# Patient Record
Sex: Male | Born: 1970 | Race: White | Hispanic: No | Smoking: Never smoker
Health system: Southern US, Community
[De-identification: ages and names within clinical notes are randomized; demographics above are authoritative.]

## PROBLEM LIST (undated history)

## (undated) DIAGNOSIS — E119 Type 2 diabetes mellitus without complications: Secondary | ICD-10-CM

## (undated) DIAGNOSIS — E785 Hyperlipidemia, unspecified: Secondary | ICD-10-CM

## (undated) DIAGNOSIS — F909 Attention-deficit hyperactivity disorder, unspecified type: Secondary | ICD-10-CM

## (undated) DIAGNOSIS — K529 Noninfective gastroenteritis and colitis, unspecified: Secondary | ICD-10-CM

## (undated) DIAGNOSIS — F319 Bipolar disorder, unspecified: Secondary | ICD-10-CM

## (undated) DIAGNOSIS — F419 Anxiety disorder, unspecified: Secondary | ICD-10-CM

## (undated) DIAGNOSIS — K279 Peptic ulcer, site unspecified, unspecified as acute or chronic, without hemorrhage or perforation: Secondary | ICD-10-CM

## (undated) DIAGNOSIS — K219 Gastro-esophageal reflux disease without esophagitis: Secondary | ICD-10-CM

## (undated) DIAGNOSIS — I10 Essential (primary) hypertension: Secondary | ICD-10-CM

## (undated) DIAGNOSIS — S66801A Unspecified injury of other specified muscles, fascia and tendons at wrist and hand level, right hand, initial encounter: Secondary | ICD-10-CM

## (undated) HISTORY — DX: Essential (primary) hypertension: I10

## (undated) HISTORY — DX: Noninfective gastroenteritis and colitis, unspecified: K52.9

## (undated) HISTORY — DX: Anxiety disorder, unspecified: F41.9

## (undated) HISTORY — PX: EYE SURGERY: SHX253

## (undated) HISTORY — DX: Bipolar disorder, unspecified: F31.9

## (undated) HISTORY — DX: Peptic ulcer, site unspecified, unspecified as acute or chronic, without hemorrhage or perforation: K27.9

---

## 2004-12-29 ENCOUNTER — Emergency Department: Payer: Self-pay | Admitting: Emergency Medicine

## 2005-01-03 ENCOUNTER — Emergency Department (HOSPITAL_COMMUNITY): Admission: EM | Admit: 2005-01-03 | Discharge: 2005-01-03 | Payer: Self-pay | Admitting: Emergency Medicine

## 2005-09-05 ENCOUNTER — Emergency Department (HOSPITAL_COMMUNITY): Admission: EM | Admit: 2005-09-05 | Discharge: 2005-09-05 | Payer: Self-pay | Admitting: Emergency Medicine

## 2005-09-08 DIAGNOSIS — K279 Peptic ulcer, site unspecified, unspecified as acute or chronic, without hemorrhage or perforation: Secondary | ICD-10-CM

## 2005-09-08 HISTORY — DX: Peptic ulcer, site unspecified, unspecified as acute or chronic, without hemorrhage or perforation: K27.9

## 2006-07-01 ENCOUNTER — Ambulatory Visit: Payer: Self-pay | Admitting: Gastroenterology

## 2006-07-01 ENCOUNTER — Inpatient Hospital Stay (HOSPITAL_COMMUNITY): Admission: EM | Admit: 2006-07-01 | Discharge: 2006-07-04 | Payer: Self-pay | Admitting: Emergency Medicine

## 2006-07-03 ENCOUNTER — Encounter (INDEPENDENT_AMBULATORY_CARE_PROVIDER_SITE_OTHER): Payer: Self-pay | Admitting: Specialist

## 2006-07-03 HISTORY — PX: ESOPHAGOGASTRODUODENOSCOPY: SHX1529

## 2006-07-09 ENCOUNTER — Inpatient Hospital Stay (HOSPITAL_COMMUNITY): Admission: AD | Admit: 2006-07-09 | Discharge: 2006-07-11 | Payer: Self-pay | Admitting: Gastroenterology

## 2006-07-10 ENCOUNTER — Encounter (INDEPENDENT_AMBULATORY_CARE_PROVIDER_SITE_OTHER): Payer: Self-pay | Admitting: *Deleted

## 2006-07-10 ENCOUNTER — Ambulatory Visit: Payer: Self-pay | Admitting: Internal Medicine

## 2006-07-10 HISTORY — PX: ESOPHAGOGASTRODUODENOSCOPY: SHX1529

## 2006-07-14 ENCOUNTER — Ambulatory Visit: Payer: Self-pay | Admitting: Gastroenterology

## 2006-09-21 ENCOUNTER — Emergency Department (HOSPITAL_COMMUNITY): Admission: EM | Admit: 2006-09-21 | Discharge: 2006-09-21 | Payer: Self-pay | Admitting: Emergency Medicine

## 2006-09-28 ENCOUNTER — Ambulatory Visit: Payer: Self-pay | Admitting: Gastroenterology

## 2006-10-16 ENCOUNTER — Ambulatory Visit: Payer: Self-pay | Admitting: Gastroenterology

## 2006-10-31 ENCOUNTER — Emergency Department (HOSPITAL_COMMUNITY): Admission: EM | Admit: 2006-10-31 | Discharge: 2006-10-31 | Payer: Self-pay | Admitting: Emergency Medicine

## 2006-11-13 ENCOUNTER — Ambulatory Visit: Payer: Self-pay | Admitting: Gastroenterology

## 2007-02-12 ENCOUNTER — Ambulatory Visit: Payer: Self-pay | Admitting: Gastroenterology

## 2007-05-18 ENCOUNTER — Ambulatory Visit: Payer: Self-pay | Admitting: Gastroenterology

## 2007-09-29 ENCOUNTER — Emergency Department (HOSPITAL_COMMUNITY): Admission: EM | Admit: 2007-09-29 | Discharge: 2007-09-29 | Payer: Self-pay | Admitting: Emergency Medicine

## 2007-10-01 ENCOUNTER — Ambulatory Visit: Payer: Self-pay | Admitting: Gastroenterology

## 2008-01-25 ENCOUNTER — Emergency Department (HOSPITAL_COMMUNITY): Admission: EM | Admit: 2008-01-25 | Discharge: 2008-01-25 | Payer: Self-pay | Admitting: Emergency Medicine

## 2008-01-25 ENCOUNTER — Encounter: Payer: Self-pay | Admitting: Orthopedic Surgery

## 2008-02-21 ENCOUNTER — Encounter: Payer: Self-pay | Admitting: Orthopedic Surgery

## 2008-02-21 ENCOUNTER — Emergency Department (HOSPITAL_COMMUNITY): Admission: EM | Admit: 2008-02-21 | Discharge: 2008-02-21 | Payer: Self-pay | Admitting: Emergency Medicine

## 2008-02-25 ENCOUNTER — Encounter: Payer: Self-pay | Admitting: Orthopedic Surgery

## 2008-02-28 ENCOUNTER — Ambulatory Visit: Payer: Self-pay | Admitting: Orthopedic Surgery

## 2008-02-28 DIAGNOSIS — M25549 Pain in joints of unspecified hand: Secondary | ICD-10-CM

## 2008-03-22 ENCOUNTER — Emergency Department (HOSPITAL_COMMUNITY): Admission: EM | Admit: 2008-03-22 | Discharge: 2008-03-22 | Payer: Self-pay | Admitting: Emergency Medicine

## 2008-04-26 ENCOUNTER — Emergency Department (HOSPITAL_COMMUNITY): Admission: EM | Admit: 2008-04-26 | Discharge: 2008-04-26 | Payer: Self-pay | Admitting: Emergency Medicine

## 2008-04-27 ENCOUNTER — Inpatient Hospital Stay (HOSPITAL_COMMUNITY): Admission: EM | Admit: 2008-04-27 | Discharge: 2008-04-30 | Payer: Self-pay | Admitting: Emergency Medicine

## 2008-05-19 ENCOUNTER — Emergency Department (HOSPITAL_COMMUNITY): Admission: EM | Admit: 2008-05-19 | Discharge: 2008-05-19 | Payer: Self-pay | Admitting: Emergency Medicine

## 2008-05-19 ENCOUNTER — Encounter: Payer: Self-pay | Admitting: Orthopedic Surgery

## 2008-05-20 ENCOUNTER — Emergency Department (HOSPITAL_COMMUNITY): Admission: EM | Admit: 2008-05-20 | Discharge: 2008-05-20 | Payer: Self-pay | Admitting: Emergency Medicine

## 2008-05-23 ENCOUNTER — Ambulatory Visit: Payer: Self-pay | Admitting: Orthopedic Surgery

## 2008-05-23 DIAGNOSIS — S62329A Displaced fracture of shaft of unspecified metacarpal bone, initial encounter for closed fracture: Secondary | ICD-10-CM | POA: Insufficient documentation

## 2008-06-14 ENCOUNTER — Ambulatory Visit: Payer: Self-pay | Admitting: Orthopedic Surgery

## 2008-06-19 ENCOUNTER — Telehealth: Payer: Self-pay | Admitting: Orthopedic Surgery

## 2008-11-25 ENCOUNTER — Emergency Department: Payer: Self-pay | Admitting: Emergency Medicine

## 2008-12-02 ENCOUNTER — Emergency Department: Payer: Self-pay | Admitting: Emergency Medicine

## 2008-12-03 ENCOUNTER — Emergency Department: Payer: Self-pay | Admitting: Emergency Medicine

## 2008-12-06 ENCOUNTER — Emergency Department: Payer: Self-pay | Admitting: Emergency Medicine

## 2009-06-27 ENCOUNTER — Emergency Department: Payer: Self-pay | Admitting: Emergency Medicine

## 2009-08-15 ENCOUNTER — Emergency Department: Payer: Self-pay | Admitting: Emergency Medicine

## 2009-10-21 ENCOUNTER — Emergency Department: Payer: Self-pay | Admitting: Emergency Medicine

## 2010-03-08 ENCOUNTER — Emergency Department: Payer: Self-pay | Admitting: Internal Medicine

## 2010-04-19 ENCOUNTER — Emergency Department: Payer: Self-pay | Admitting: Emergency Medicine

## 2010-06-19 ENCOUNTER — Emergency Department: Payer: Self-pay | Admitting: Emergency Medicine

## 2010-06-24 ENCOUNTER — Telehealth (INDEPENDENT_AMBULATORY_CARE_PROVIDER_SITE_OTHER): Payer: Self-pay

## 2010-07-03 ENCOUNTER — Emergency Department: Payer: Self-pay | Admitting: Emergency Medicine

## 2010-07-05 ENCOUNTER — Ambulatory Visit: Payer: Self-pay | Admitting: Gastroenterology

## 2010-07-05 DIAGNOSIS — R112 Nausea with vomiting, unspecified: Secondary | ICD-10-CM

## 2010-07-05 DIAGNOSIS — K589 Irritable bowel syndrome without diarrhea: Secondary | ICD-10-CM

## 2010-07-05 DIAGNOSIS — S025XXA Fracture of tooth (traumatic), initial encounter for closed fracture: Secondary | ICD-10-CM | POA: Insufficient documentation

## 2010-07-05 DIAGNOSIS — R1013 Epigastric pain: Secondary | ICD-10-CM

## 2010-07-09 ENCOUNTER — Telehealth (INDEPENDENT_AMBULATORY_CARE_PROVIDER_SITE_OTHER): Payer: Self-pay

## 2010-07-16 ENCOUNTER — Emergency Department: Payer: Self-pay | Admitting: Emergency Medicine

## 2010-07-20 ENCOUNTER — Emergency Department: Payer: Self-pay | Admitting: Unknown Physician Specialty

## 2010-07-23 ENCOUNTER — Telehealth (INDEPENDENT_AMBULATORY_CARE_PROVIDER_SITE_OTHER): Payer: Self-pay

## 2010-07-29 ENCOUNTER — Telehealth (INDEPENDENT_AMBULATORY_CARE_PROVIDER_SITE_OTHER): Payer: Self-pay

## 2010-08-05 ENCOUNTER — Encounter: Payer: Self-pay | Admitting: Gastroenterology

## 2010-08-05 ENCOUNTER — Ambulatory Visit: Payer: Self-pay | Admitting: Gastroenterology

## 2010-08-05 DIAGNOSIS — K219 Gastro-esophageal reflux disease without esophagitis: Secondary | ICD-10-CM

## 2010-08-05 DIAGNOSIS — I10 Essential (primary) hypertension: Secondary | ICD-10-CM

## 2010-08-06 ENCOUNTER — Telehealth (INDEPENDENT_AMBULATORY_CARE_PROVIDER_SITE_OTHER): Payer: Self-pay

## 2010-08-09 ENCOUNTER — Telehealth (INDEPENDENT_AMBULATORY_CARE_PROVIDER_SITE_OTHER): Payer: Self-pay

## 2010-09-30 ENCOUNTER — Encounter: Payer: Self-pay | Admitting: Emergency Medicine

## 2010-10-08 NOTE — Progress Notes (Signed)
Summary: phone note/ pt now dx with hypertension  Phone Note Call from Patient   Caller: Patient Summary of Call: Pt called to say he has been diagnosed with hypertension. PCP started on Amlodipine yesterday. He is supposed to go back in a few days and have some labs if they are OK he will dc amlodipine and start Lisinopril and Metoprolol. He just wanted to inform Dr. Darrick Penna and extenders here. Initial call taken by: Cloria Spring LPN,  July 23, 2010 11:57 AM

## 2010-10-08 NOTE — Progress Notes (Signed)
Summary: FYI  on BP meds  Phone Note Call from Patient   Caller: Patient Summary of Call: Pt left Vm that he saw PCP this AM for BP. Amlodipine was incresed to 10mg  once daily and he was started on Lisinopril but he wasn't sure of the dosage.  Initial call taken by: Cloria Spring LPN,  August 06, 2010 11:53 AM

## 2010-10-08 NOTE — Progress Notes (Signed)
Summary: phone note/ ? about pain med til appt  Phone Note Call from Patient   Caller: Patient Summary of Call: Pt called. Said he hasn't been seen here for a while. He moved to [place] after separating from his wife. He has had to go to ED in [place] for a couple of times. Was started on Reglan and it has helped alot. Was just back at the ED last Wed. ED doctor would not give Rx for Reglan. Gave him a Rx for Tramadol. Said he tried to explain to them to give him Vicodin because of his stomach but they would not. He has scheduled appt with Tana Coast, PA for 07/05/2010. He wants to know if Dr.Fields will call him give him Rx for Vicodin or say it is OK to take the Tramadol until his appt here. He can be reached at [phone].  Initial call taken by: Cloria Spring LPN,  June 24, 2010 11:02 AM     Appended Document: phone note/ ? about pain med til appt Please call pt. He may use Ultram. We do NOT Rx narcotics for chronic abd pain.  Appended Document: phone note/ ? about pain med til appt Pt informed.

## 2010-10-08 NOTE — Progress Notes (Signed)
Summary: phone note/ pt having pains below right ribs  Phone Note Call from Patient   Caller: Patient Summary of Call: Pt called and said he has had a dull pain in his right side below ribs x one and a half days. 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 He said it was so bad yesterday and he had  to call out of work. He took some Vicodin 7.5 that he had left and it helped some. He wants to know what Dr. Darrick Penna advises. He is aware she is at the hospital and it will be later when we call. He can be reached at [phone]. Initial call taken by: Cloria Spring LPN,  August 09, 2010 9:35 AM     Appended Document: phone note/ pt having pains below right ribs Please call pt. He has IBS and will have pain intermittently. Use Maalox or Mylanta as needed pain and follow a full liquid diet for 24-48 hours if he has pain. Avoid dairy.  Appended Document: phone note/ pt having pains below right ribs LMOM to call.  Appended Document: phone note/ pt having pains below right ribs Pt informed.

## 2010-10-08 NOTE — Progress Notes (Signed)
Summary: pain rx  Phone Note Call from Patient Call back at Home Phone [phone]   Caller: Patient Summary of Call: pt called- he went last week to get his wisdom teeth removed and they wouldnt do it because his bp was 154/100. pt goes back to his pcp on 08/23/10 for a physical and to see about having his bp meds changed. Pt is requesting vicodin 5/500  to last untill his appt here with [facility] on Monday the 28th and then he will talk to her about it. He also stated the only providers in this office he wanted and trusted was [facility] and [facility]. pt uses [facility] [address], [id number] Initial call taken by: Hendricks Limes LPN,  July 29, 2010 11:15 AM     Appended Document: pain rx    Prescriptions: HYDROCODONE-ACETAMINOPHEN 5-500 MG TABS (HYDROCODONE-ACETAMINOPHEN) 1 by mouth daily as needed for pain  #20 x 0   Entered and Authorized by:   Leanna Battles. Dixon Boos   Signed by:   Leanna Battles Lewis PA-C on 07/29/2010   Method used:   Printed then faxed to ...       [facility] [address] (retail)       [address]       [place], [place]  [address]       Ph: [phone]       Fax: [phone]   RxID:   [id number]     Appended Document: pain rx [facility] pt aware

## 2010-10-08 NOTE — Assessment & Plan Note (Signed)
Summary: ALOT OF STOMACH PAIN/HX OF GU/LAW   Visit Type:  f/u Primary Care Provider:  Dwana [name]  Chief Complaint:  abd pain.  History of Present Illness: [name] is a pleasant 40 y/o WM, with h/o intermittent abd pain, n/v, treated for functional gut disorder by Dr. Darrick Penna. He transferred his care when he moved to [facility] a couple of years ago. He states he wants to resume his care here with Dr. Darrick Penna. He reports having two severe episodes of abd pain, n/v in last two years. One year ago, he reports being hospitalized at [facility].   Aug 12th, went to hospital to ED in [place] and two weeks ago as well. Was put on Reglan in 8/11, works well. Tues night went back to ED, sat for four hours, was not seen. Taking Aleve/ibuprofen for broke tooth for past three weeks. Seeing dentist, Dr. Montez Morita, having to make payments to have tooth pulled. Has appt 07/15/10. Given tramadol in ED, did not help pain. He reports taking two bottles of Aleve for tooth pain.   He states he has lot of stress or custody issues with his 38 year old son. Has not seen him in over one year. Unable to afford lawyer to draw up custody or visitation agreements. Found out today that ex-wife took son out of state without permission. Stomach issues worse with stress.    C/O cramping in upper abd, stomach gets sour, starts refluxing/nausea/dry heaves. Happens once per month. Bowels 70% loose. BM 2 per day, no [name], brbpr. Cramping usually leads to bowel movements. States Reglan controlled his symptoms while on it.   In 2007, during hospitalization, he was suspected to have intermittent gastric volvulus/torsion but could not be proven. He had EGD on both 07/03/06 and 07/10/06. First EGD, antrum was dusky in appearance and there was edema and hemorrhage of the cardia. On second EGD, two very small antral ulcers but overall appearance of stomach was much improved.    Current Medications (verified): 1)  Depakote 500  Mg  Tbec (Divalproex Sodium) .... One By Mouth 4 X Day 2)  Celexa 20 Mg Tabs (Citalopram Hydrobromide) .... Take 1 Tablet By Mouth Once A Day 3)  Zantac 150 Mg Tabs (Ranitidine Hcl) .... Take 1 Tablet By Mouth Two Times A Day 4)  Adderall 20 Mg Tabs (Amphetamine-Dextroamphetamine) .... Take 1 Tablet By Mouth Three Times A Day  Allergies (verified): 1)  ! Pcn 2)  ! Ibuprofen 3)  ! Morphine  Social History: Patient is divorced. 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 Head:  Normocephalic and atraumatic. Eyes:  sclera nonicteric Mouth:  op moist Lungs:  Clear throughout to auscultation. Heart:  Regular rate and rhythm; no murmurs, rubs,  or bruits. Abdomen:  Soft. Flat. Positive BS. No abd bruit or hernia. Mild epigastric tenderness. No rebound or guarding.  Extremities:  No clubbing, cyanosis, edema or deformities noted. Neurologic:  Alert and  oriented x4;  grossly normal neurologically. Skin:  Intact without significant lesions or rashes. Psych:  Alert and cooperative. Normal mood and affect.  Impression & Recommendations:  Problem # 1:  EPIGASTRIC PAIN (ICD-789.06)  Upper abd pain associated with N/V/D. H/O IBS. Symptoms worse with increased stress. Pain worse today since he found out that his ex-wife "took off" with his 46 yr old son. He has been consuming significant amount of NSAIDS due to fractured tooth. Scheduled to have tooth extracted on 07/15/10 after he pays the dentist. Cannot exclude gastritis. He has had recurrent symptoms off/on since 8/11 (prior to NSAID  consumption). Needs to be treated for GERD/gastritis/PUD/IBS. If symptoms do not improve, then consider further w/u. At this time, no alarm symptoms.   RX for bentyl, hydrocodone/apap (for dental pain), samples of Nexium given. He can take OTC PPI when samples run out. If symptoms worse, he is to call. He should notify us if sees blood in stool or go to ED for [name].  Otherwise OV with Dr. Darrick Penna in four weeks.   Orders: Est. Patient Level II ([address]) Prescriptions: DICYCLOMINE HCL 10 MG CAPS (DICYCLOMINE HCL) one by mouth up to four times daily for abd cramps and loose stools  #120 x 0   Entered and Authorized by:   Leanna Battles. Dixon Boos   Signed by:   Leanna Battles Lewis PA-C on 07/05/2010   Method used:   Print then Give to Patient   RxID:   [id number] HYDROCODONE-ACETAMINOPHEN 7.5-325 MG TABS (HYDROCODONE-ACETAMINOPHEN) one by mouth every 4-6 hours as needed severe pain  #20 x 0   Entered and Authorized by:   Leanna Battles. Dixon Boos   Signed by:   Leanna Battles Lewis PA-C on 07/05/2010   Method used:   Print then Give to Patient   RxID:   [id number]   Appended Document: ALOT OF STOMACH PAIN/HX OF GU/LAW 4 WK F/U IS IN THE COMPUTER

## 2010-10-08 NOTE — Progress Notes (Signed)
Summary: pain rx too strong. pt requesting something different  Phone Note Call from Patient Call back at [facility] Phone [phone]   Caller: Patient Summary of Call: pt called- the pain medication he was given during his ov is too strong. pt is requesting hydrocodone/apap 5/500. makes pt too sleepy to work. pt uses [facility] [place] [id number]. Initial call taken by: Hendricks Limes LPN,  July 09, 2010 11:24 AM     Appended Document: pain rx too strong. pt requesting something different    Prescriptions: HYDROCODONE-ACETAMINOPHEN 5-500 MG TABS (HYDROCODONE-ACETAMINOPHEN) 1 by mouth daily as needed for pain  #20 x 0   Entered and Authorized by:   Joselyn Arrow FNP-BC   Signed by:   Joselyn Arrow FNP-BC on 07/09/2010   Method used:   Printed then faxed to ...         RxID:   [id number]     Appended Document: pain rx too strong. pt requesting something different rx faxed to [facility]. pt aware

## 2010-10-08 NOTE — Letter (Signed)
Summary: FU WITH SF IN 4 WEEKS,ABD PAIN,DIARRHEA/SS   Current Medications (verified): 1)  Depakote 500 Mg  Tbec (Divalproex Sodium) .... 4 At Bedtime 2)  Celexa 20 Mg Tabs (Citalopram Hydrobromide) .... Take 1 Tablet By Mouth Once A Day 3)  Adderall 30 Mg Tabs (Amphetamine-Dextroamphetamine) .... 1/2 Three Times A Day 4)  Dicyclomine Hcl 10 Mg Caps (Dicyclomine Hcl) .... One By Mouth Up To Four Times Daily For Abd Cramps and Loose Stools 5)  Hydrocodone-Acetaminophen 5-500 Mg Tabs (Hydrocodone-Acetaminophen) .Marland Kitchen.. 1 By Mouth Daily As Needed For Pain 6)  Amlodipine Besylate 5 Mg Tabs (Amlodipine Besylate) .... Once Daily 7)  Nexium 40 Mg Cpdr (Esomeprazole Magnesium) .... Once Daily 8)  Xanax 1 Mg Tabs (Alprazolam) .... As Needed  Allergies (verified): 1)  ! Pcn 2)  ! Ibuprofen 3)  ! Morphine  Vital Signs:  Patient profile:   40 year old male Height:      69 inches Weight:      199 pounds BMI:     29.49 Temp:     98.3 degrees F oral Pulse rate:   100 / minute BP sitting:   140 / 120  (left arm) Cuff size:   regular  Vitals Entered By: Hendricks Limes LPN (August 05, 2010 2:25 PM)  Appended Document: IBS, GERD OPV IN 6 MONTHS IN COMPUTER  Appended Document: Orders Update    Clinical Lists Changes  Orders: Added new Service order of Est. Patient Level III ([address]) - Signed      Appended Document: IBS, GERD 6 MONTH F/U OPV IS IN THE COMPUTER

## 2010-10-10 ENCOUNTER — Telehealth (INDEPENDENT_AMBULATORY_CARE_PROVIDER_SITE_OTHER): Payer: Self-pay
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
## 2010-10-14 ENCOUNTER — Other Ambulatory Visit: Payer: Self-pay | Admitting: Internal Medicine

## 2010-10-14 ENCOUNTER — Encounter: Payer: Self-pay | Admitting: Internal Medicine

## 2010-10-14 DIAGNOSIS — R110 Nausea: Secondary | ICD-10-CM

## 2010-10-15 ENCOUNTER — Other Ambulatory Visit (HOSPITAL_COMMUNITY): Payer: Self-pay

## 2010-10-16 NOTE — Progress Notes (Addendum)
Summary: N/V  Phone Note Call from Patient Call back at Home Phone [phone]   Caller: Patient Summary of Call: pt called- having increased abd pain and nausea x 1 week. pt is taking bentyl, protonix and tramadol. He said he has been watching his diet and is trying to stick to clear liquids for the past 2 days. pt is requesting phenergan and vicodin called to [facility] [address]- [phone]. pt stated if hes not feeling better next week he will make appt to come in.  Initial call taken by: Hendricks Limes LPN,  October 10, 2010 3:50 PM     Appended Document: N/V will not prescribe vicodin (per Dr. Evelina Dun last note). OV next week. May need upper endoscopy. In interim, obtain LFTs, [place] abd. May have short course of phenergan, 25 mg by mouth, 1 every 6-8 hours as needed nausea, disp#30, no refills. If increased N/V, pain, present to nearest medical facility.   Appended Document: N/V LM on voicemail per pt request. [facility]. informed pt to call back and let me know which lab to send lab order  Appended Document: N/V pt is aware of his appt on 2/13 @ 3pm w/AS  Appended Document: N/V Pt scheduled for abd u/s 10/15/10@10 :30.Marland KitchenMarland KitchenPt aware of appt.  Appended Document: N/V pt never called back about lab, lab order mailed to pt because he lives in [place].

## 2010-10-17 ENCOUNTER — Encounter (INDEPENDENT_AMBULATORY_CARE_PROVIDER_SITE_OTHER): Payer: Self-pay

## 2010-10-17 ENCOUNTER — Encounter: Payer: Self-pay | Admitting: Gastroenterology

## 2010-10-17 DIAGNOSIS — R109 Unspecified abdominal pain: Secondary | ICD-10-CM | POA: Insufficient documentation

## 2010-10-21 ENCOUNTER — Ambulatory Visit: Payer: Self-pay | Admitting: Gastroenterology

## 2010-10-21 ENCOUNTER — Encounter: Payer: Self-pay | Admitting: Gastroenterology

## 2010-10-21 ENCOUNTER — Encounter: Payer: Self-pay | Admitting: Internal Medicine

## 2010-10-21 DIAGNOSIS — R109 Unspecified abdominal pain: Secondary | ICD-10-CM

## 2010-10-21 DIAGNOSIS — R197 Diarrhea, unspecified: Secondary | ICD-10-CM | POA: Insufficient documentation

## 2010-10-24 NOTE — Letter (Signed)
Summary: ABD U/S ORDER  ABD U/S ORDER   Imported By: Ave Filter 10/14/2010 11:53:16  _____________________________________________________________________  External Attachment:    Type:   Image     Comment:   External Document

## 2010-10-24 NOTE — Letter (Signed)
Summary: Recall, Labs Needed  [facility]  [address]   [place], [place] [address]   Phone: [phone]  Fax: [phone]    October 17, 2010  REEDY BIERNAT [address] [place], [place]  [address] [birth year]   Dear Mr. Raso,   Our records indicate it is time to repeat your blood work.  You can take the enclosed form to the lab on or near the date indicated.  Please make note of the new location of the lab:   [address], 2nd floor   [facility]  Our office will call you within a week to ten business days with the results.  If you do not hear from [place] in 10 business days, you should call the office.  If you have any questions regarding this, call the office at [phone], and ask for the nurse.  Labs are due ASAP.   Sincerely,    Hendricks Limes LPN  [facility] Ph: [phone]   Fax: [phone]

## 2010-10-24 NOTE — Miscellaneous (Signed)
Summary: Orders Update  Clinical Lists Changes  Problems: Added new problem of ABDOMINAL PAIN (ICD-789.00) Orders: Added new Test order of T-Hepatic Function [phone]) - Signed

## 2010-10-29 ENCOUNTER — Other Ambulatory Visit (HOSPITAL_COMMUNITY): Payer: Self-pay

## 2010-10-30 NOTE — Assessment & Plan Note (Signed)
Summary: nausea,vomiting OV next week per [facility]   Vital Signs:  Patient profile:   40 year old male Height:      69 inches Weight:      205 pounds BMI:     30.38 Temp:     98.2 degrees F oral Pulse rate:   80 / minute BP sitting:   130 / 80  (left arm) Cuff size:   large  Vitals Entered By: Cloria Spring LPN (October 21, 2010 3:01 PM)  Visit Type:  Follow-up Visit Primary Care Provider:  Duncan Dull, M.D.  CC:  nausea/vomiting.  History of Present Illness: Pt presents today in f/u for chronic abdominal pain, N/V. Hx of IBS. We had set up an [place] of abd and labs that he did not have done prior to this appt. 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 For three weeks feels bloated, has a pain that runs across top portion of abdomen, like a "cramp", decreased appetite, states it is 1/2 what it was. Has alternations of constipation and diarrhea. remote hx of blood in stool, none recently. Wakes up nauseated. Only thing that helps is dairy products. Eats 1/2 gallon of ice cream every few days.  Taking Protonix daily as instructed. Now taking Bentyl threex/day. States pain is constant.  Denies use of NSAIDs, goody's powders. Taking vicodin for stomach to "get him through". c/o continued reflux.   Current Medications (verified): 1)  Depakote 500 Mg  Tbec (Divalproex Sodium) .... 4 At Bedtime 2)  Celexa 20 Mg Tabs (Citalopram Hydrobromide) .... Take 1 Tablet By Mouth Once A Day 3)  Adderall 30 Mg Tabs (Amphetamine-Dextroamphetamine) .... 1/2 Three Times A Day 4)  Dicyclomine Hcl 10 Mg Caps (Dicyclomine Hcl) .... One By Mouth Up To Four Times Daily For Abd Cramps and Loose Stools 5)  Hydrocodone-Acetaminophen 5-500 Mg Tabs (Hydrocodone-Acetaminophen) .Marland Kitchen.. 1 By Mouth Daily As Needed For Pain 6)  Amlodipine Besylate 10 Mg Tabs (Amlodipine Besylate) .... Once Daily 7)  Xanax 1 Mg Tabs (Alprazolam) .... As Needed 8)  Protonix 40 Mg Tbec (Pantoprazole Sodium) .Marland Kitchen.. 1 By Mouth Daily 9)   Lisinopril .... Take 1 Tablet By Mouth Once A Day  Allergies (verified): 1)  ! Pcn 2)  ! Ibuprofen 3)  ! Morphine  Past History:  Past Medical History: Last updated: 08/05/2010 Mild PUD 2007 **EGD/bX X2-CHRONIC ACTIVE GASTRITIS, NO H. PYLORI IBS BIPOLAR  Past Surgical History: Last updated: 02/28/2008 none  Family History: Mother: DM, living Father: HTN, CVA. living Family History of Diabetes No FH of Colon Cancer:  Social History: Patient is divorced.  Nonsmoker, no alcohol or drugs. 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 Eyes:  Denies blurring, irritation, and discharge. ENT:  Denies sore throat, hoarseness, and difficulty swallowing. CV:  Denies chest pains and syncope. Resp:  Denies dyspnea at rest and wheezing. GI:  See HPI. GU:  Denies urinary burning and urinary frequency. MS:  Denies joint pain / LOM, joint swelling, and joint stiffness. Derm:  Denies rash, itching, and dry skin. Neuro:  Denies weakness and syncope. Psych:  Denies depression and anxiety. Endo:  Denies cold intolerance and heat intolerance.  Physical Exam  General:  Well developed, well nourished, no acute distress. Head:  Normocephalic and atraumatic. Neck:  Supple; no masses or thyromegaly. Lungs:  Clear throughout to auscultation. Heart:  Regular rate and rhythm; no murmurs, rubs,  or bruits. Abdomen:  +  BS, soft, tender to palpation epigastric region, diffusely mildly tender abdomen, no HSM, no rebound or guarding, no masses noted.  Msk:  Symmetrical with no gross deformities. Normal posture. Pulses:  Normal pulses noted. Neurologic:  Alert and  oriented x4;  grossly normal neurologically. Skin:  Intact without significant lesions or rashes. Psych:  Alert and cooperative. Normal mood and affect.   Impression & Recommendations:  Problem # 1:  ABDOMINAL PAIN-MULTIPLE SITES (ICD-19.19)  40 year old  male with hx of chronic abdominal pain, likely r/t IBS, yet now presenting with a 3 week hx of "crampy" constant epigastric pain as well as diffuse abdominal pain, +n/v, especially nauseated in am. c/o decreased appetite. On Protonix daily. Denies use of NSAIDs, goodys, etc. Does have hx of PUD in past (EGD 2007). Diff include gastritis vs PUD, functional abdominal pain, doubt biliary component.   Continue Protonix EGD in OR with Dr. Jena Gauss in near future secondary to polypharmacy: the R/B/A have been discussed in detail; he has given verbal consent Labs to include CBC, CMP, lipase, amylase  Orders: Est. Patient Level II ([address])  Problem # 2:  IRRITABLE BOWEL SYNDROME (ICD-564.1)  hx of IBS-mixed, yet worsening of abdominal pain. Takes bentyl as needed, does have remote hx of brbpr in past. None currently. Likely hematochezia r/t benign anorectal source, yet pt has never had colonoscopy.   TCS with Dr. Jena Gauss in OR (along with EGD): the R/B/A have been discussed in detail; he has given verbal consent.  As of note, pt requested narcotics. Informed he may take extra strength tylenol.   Orders: Est. Patient Level II ([address])  Other Orders: T-CBC w/Diff [phone]) T-Amylase [phone]) T-Lipase [id number]) T-Comprehensive Metabolic Panel [phone])   Orders Added: 1)  T-CBC w/Diff [[id number]] 2)  T-Amylase [[id number]] 3)  T-Lipase [[id number]] 4)  T-Comprehensive Metabolic Panel [[id number]] 5)  Est. Patient Level II [[address]]

## 2010-10-30 NOTE — Letter (Addendum)
Summary: TCS/EGD ORDER  TCS/EGD ORDER   Imported By: Ave Filter 10/21/2010 16:06:35  _____________________________________________________________________  External Attachment:    Type:   Image     Comment:   External Document  Appended Document: TCS/EGD ORDER Pt NO SHOWED for his pre-op visit. We were unable to reach the pt so we had to cx him until we make contact.

## 2010-11-01 ENCOUNTER — Other Ambulatory Visit (HOSPITAL_COMMUNITY): Payer: Self-pay

## 2010-11-05 ENCOUNTER — Encounter: Payer: Self-pay | Admitting: Internal Medicine

## 2010-11-05 ENCOUNTER — Ambulatory Visit (HOSPITAL_COMMUNITY): Admission: RE | Admit: 2010-11-05 | Payer: Self-pay | Source: Ambulatory Visit | Admitting: Internal Medicine

## 2010-11-19 ENCOUNTER — Encounter (INDEPENDENT_AMBULATORY_CARE_PROVIDER_SITE_OTHER): Payer: Self-pay | Admitting: *Deleted

## 2010-11-26 NOTE — Letter (Signed)
Summary: Radiology Test Reminder  [facility]  [address]   [place], [place] [address]   Phone: [phone]  Fax: [phone]     November 19, 2010   XYLER TERPENING [address] [place], [place]  [address]  [place] 11/18/1970  Dear Mr. Gallery,  During your last appointment, your doctor requested you have an Ultrasound.  Our records indicate you have not had this done.  Remember it is very important to follow your doctor's instructions.  Please have this done as soon as possible.  If you have questions regarding this appointment, please call our office and we can reschedule this for you.  It is important that patients and their doctor work together in the management and treatment of their health care.  If you have already had your test done, please disregard this letter.  Thank you,    Ave Filter  [facility] Ph: [phone]   Fax: [phone]

## 2011-01-10 ENCOUNTER — Encounter: Payer: Self-pay | Admitting: Gastroenterology

## 2011-01-21 NOTE — Assessment & Plan Note (Signed)
NAMEMarland Kitchen  [name] [name], [name] [name]           CHART#:  [id number]   DATE:  02/12/2007                       DOB:  [birth year]   PROBLEM LIST:  1. 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  2. Bipolar disorder.  3. Borderline diabetes.  4. Allergy to PENICILLIN.   SUBJECTIVE:  [name] [name] is a 40 year old male who presents as a  return patient visit.  He continues to have intermittent bouts of right  upper quadrant pain associated with vomiting and diarrhea.  He has  modified his diet in that he eats nothing fried.  When he was initially  seen and evaluated in November 2007, he weighed 197 pounds, and today he  weighs 219 pounds.  He states that he only eats chicken and pasta, but  occasionally he eats a medium-rare steak.  He denies any alcohol use.  He continues to have episodes of sharp pain in his right upper quadrant  2-3 times a week.  He describes it as a pulsating pain that hits him and  hits him again.  It might cycle every 15 minutes and last up to four  hours.  It is associated with nausea and vomiting.  He had an upper  endoscopy in October, 2007 which showed mild chronic active gastritis,  and again in November, 2007 with duodenal biopsies as well as gastric  biopsies, which showed chronic active gastritis as well as no evidence  of celiac sprue.  His symptoms have not ever again been as severe as in  October and November, 2007.  He has not been using anti-inflammatory  drugs.  His pain has been intermittently controlled with Vicodin.  He  has never exhibited any evidence of drug-seeking behavior.  He has 0-2  soft bowel movements a day.  Some of his pain is reproducible with  movement.  He has no family history of pancreatic disease.  He does not  drink any alcohol.   MEDICATIONS:  1. Depakote.  2. Blase Mess.  3. Rozarem as needed.  4. Multivitamin.  5. Bentyl 20 mg three times a day.  6. Prilosec 20 mg twice daily.   OBJECTIVE:  VITAL SIGNS:  Weight 219 pounds.  Height 5 feet 10.  Temp  97.8, blood pressure 122/82, pulse 76.  GENERAL:  He is in no apparent distress.  Alert and oriented x4.  HEENT:  Normocephalic and atraumatic.  Pupils are equal and reactive to light.  Mouth: No oral lesions.  Posterior pharynx without erythema or exudate.  NECK:  Full range of motion with no lymphadenopathy.  LUNGS:  Clear to  auscultation bilaterally. CARDIOVASCULAR:  Regular rhythm.  No murmur.  Normal S1 and S2.  ABDOMEN:  Bowel sounds are present.  Soft.  Nondistended. He has increase in his right upper quadrant pain with  straight leg raises.  The pain is described as severe.  He also has pain  increased with raising his head and shoulders off the bed, and he  describes it as moderate tenderness.  This is consistent with a positive  Carnett's sign.  NEURO:  He has no focal neurological deficits.  EXTREMITIES:  No clubbing, cyanosis or edema.   ASSESSMENT:  [name] is a 40 year old male who has intermittent  right upper quadrant pain that is associated with nausea, vomiting, and  loose stool.  He has some component of his right upper quadrant pain  that may be related to a biliary source.  He has a component of his  right upper quadrant pain that appears to be musculoskeletal.  Thank you  for allowing me to see the patient in consultation.  My recommendations  follow.   RECOMMENDATIONS:  1. I will schedule an endoscopic ultrasound for patient with Dr. Wendall Papa to rule out distal common bile duct stone, microlithiasis,      or pancreatic etiology for his right upper quadrant pain.  2. He is given a prescription for Phenergan #25 1-2 p.o. every 6 hours      as needed for pain with one refill.  3. He is given a prescription for Vicodin 5/500  #20 1-2 p.o. every 6      hours as needed for pain with one refill.  He is asked to      distinguish between his musculoskeletal pain and his possible      biliary pain.  For his musculoskeletal pain, he is asked to use      Tylenol Arthritis.  4. He has a follow-up appointment to see me in one month.       Kassie Mends, M.D.  Electronically Signed     SM/MEDQ  D:  02/12/2007  T:  02/13/2007  Job:  [id number]   cc:   Rachael Fee, MD

## 2011-01-21 NOTE — H&P (Signed)
NAME:  [name] [name], [name] [name]          ACCOUNT NO.:  [id number]   MEDICAL RECORD NO.:  [id number]          PATIENT TYPE:  INP   LOCATION:  A332                          FACILITY:  [facility]   PHYSICIAN:  Angus G. Renard Matter, MD   DATE OF BIRTH:  01-13-1971   DATE OF ADMISSION:  DATE OF DISCHARGE:  LH                              HISTORY & PHYSICAL   A 40 year old white male presented to the ED with chief complaint of  pain in his left foot and some swelling.  Apparently, the patient is a  Leisure centre manager in [place], noted some pain, tenderness, and swelling in  his left foot approximately 3 days ago.  This became progressively  worse.  He was seen in the emergency room initially and given antibiotic  IV vancomycin and Rocephin.  Sent home on IV antibiotics and he just  became progressively worse and he came back to emergency room today.  He  was seen again by ED physician.  The inflammation and swelling in his  left foot and leg were worse and it was felt that he should be admitted  for IV antibiotics with this possibly being MRSA.   SOCIAL HISTORY:  The patient does not smoke or drink alcohol.   FAMILY HISTORY:  See previous record.   SURGICAL AND MEDICAL HISTORY:  The patient has had no prior surgery.  He  did have treatment for ulcer in 2007.   DRUG ALLERGIES:  PENICILLIN, rash; IBUPROFEN, he experienced nausea.   HOME MEDICATIONS:  1. Depakote 2000 mg at bedtime.  2. Multivitamin daily.  3. Celexa 20 mg daily.  4. Omeprazole 30 mg b.i.d.  5. Keflex oral.  6. Bactrim DS oral.  7. Percocet every 4 hours p.r.n.   REVIEW OF SYSTEMS:  HEENT:  Negative.  CARDIOPULMONARY:  No cough or  chest pain.  GI:  No nausea, vomiting, diarrhea, or pain.  No bleeding.   PHYSICAL EXAMINATION:  GENERAL:  Alert, well-developed white male.  VITAL SIGNS:  Blood pressure 109/61, respiration 20, pulse 67, and  temperature 97.8.  HEENT:  Eyes, PERRLA.  TMs negative.  Oropharynx benign.  NECK:  Supple.  No  JVD or thyroid abnormalities.  LUNGS:  Clear to P&A.  HEART:  Regular rhythm.  No cardiomegaly.  No murmurs.  ABDOMEN:  No palpable organs or masses.  No organomegaly.  SKIN:  The patient has swollen, tender, and inflamed left foot and lower  leg.   ASSESSMENT:  The patient is thought to have cellulitis, possible  methicillin-resistant Staphylococcus aureus.  He has been started on  Rocephin and vancomycin.  He is being admitted for further therapy with  this.      Angus G. Renard Matter, MD  Electronically Signed     AGM/MEDQ  D:  04/27/2008  T:  04/28/2008  Job:  [id number]

## 2011-01-21 NOTE — Group Therapy Note (Signed)
NAME:  [name], [name] NO.:  [id number]   MEDICAL RECORD NO.:  [id number]          PATIENT TYPE:  INP   LOCATION:  A332                          FACILITY:  [facility]   PHYSICIAN:  Catalina Pizza, M.D.        DATE OF BIRTH:  [birth year]   DATE OF PROCEDURE:  04/29/2008  DATE OF DISCHARGE:                                 PROGRESS NOTE   SUBJECTIVE:  [name] [name] is a 40 year old gentleman who presented with  5-day history of left foot swelling, erythema, and pain and felt to have  cellulitis related to breaking the skin between his fourth and fifth  toes, was seen in the emergency department x2 days and felt the need for  IV antibiotics.  He was admitted for IV antibiotics today.  He is doing  well, pain was much improved.  He does not have any difficulty with  ambulation.  Concern was raised that it could be MRSA, but there is no  acute signs of this at this time from  microbiology data.  The patient  denies any other problems.   OBJECTIVE:  VITAL SIGNS:  Temperature is 97.9, blood pressure is 109/62,  pulse is 76, respirations 20, and satting 96% on room air.  GENERAL:  This is a white male lying in bed in no acute distress.  HEENT:  Unremarkable.  LUNGS:  Clear to auscultation bilaterally.  HEART:  Regular rate and rhythm.  No murmurs, gallops, or rubs.  ABDOMEN:  Soft, nontender, and nondistended.  Positive bowel sounds.  EXTREMITIES:  Very trace edema on the dorsum of his foot, but improved.  Palpable pulses in all extremities.  SKIN:  Erythema has resolved in his left foot, he still has some mild  erythema between his fourth and fifth toes, but overall appears to look  symmetric compared to the other foot.   LABORATORY DATA:  CMET shows sodium 141, potassium 3.8, chloride 105,  CO2 29, glucose 130, BUN 7, creatinine is 0.87, total bili is 0.3, alk  phos 48, SGOT 15, SGPT 15, total protein 5.7, albumin 3.3, and calcium  of 8.3.  CBC shows a white count of 6.2,  hemoglobin of 13.7, and  platelet count of 190.  Repeat BMET was normal as well today.   IMPRESSION:  This is a 40 year old white male with cellulitis of the  left foot.   ASSESSMENT AND PLAN:  Cellulitis.  We will continue with antibiotics  through today with the Rocephin and vancomycin given the significant  improvement.  We will finish today's dose of IV antibiotics and change  over to oral regimen tomorrow.  We will likely continue on the Septra  and then if has any worsening pain or erythema returning, then start  back on the Keflex.   DISPOSITION:  As mentioned above, likely will be discharged tomorrow and  we will need routine followup of this.  Was previously assigned to the  Health Department  and may continue with this.      Catalina Pizza, M.D.  Electronically Signed     ZH/MEDQ  D:  04/29/2008  T:  04/30/2008  Job:  [id number]

## 2011-01-21 NOTE — Group Therapy Note (Signed)
NAME:  BENNETT, RAM NO.:  [id number]   MEDICAL RECORD NO.:  [id number]          PATIENT TYPE:  INP   LOCATION:  A332                          FACILITY:  [facility]   PHYSICIAN:  Catalina Pizza, M.D.        DATE OF BIRTH:  1970/12/10   DATE OF PROCEDURE:  DATE OF DISCHARGE:  04/30/2008                                 PROGRESS NOTE   Of note, history and physical is not available at this time by Dr.  Renard Matter.   SUBJECTIVE:  Briefly, this is a 40 year old white male who has had  approximately 5-day history of increased redness and swelling in his  left foot, came to the Emergency Department on Wednesday and was given a  dose of vancomycin and sent home with Keflex and Septra; was to return  on Thursday to get reassessed, rechecked, and was seen again by  emergency room doctor and felt that the erythema had spread even with  the antibiotics, thus wanted admission to the hospital for further IV  antibiotics.  He did not have any systemic issues with low white count  and has had no significant fever.  Emergency room doctor did bring  possibility of MRSA, although not have any specific indications for  this.  He also did have few pustules that came out on his midchest.  Question was related to infection versus reaction to medicines.  At this  time, the patient states that he does not have any significant pain,  feels that the redness and swelling is better than before, has no other  complaints.   OBJECTIVE:  VITAL SIGNS:  Temperature is 97.6, blood pressure 121/68,  pulse 78, respirations 20, and satting 96% on room air.  GENERAL:  This is a white male lying in bed in no acute stress.  HEENT:  Unremarkable.  LUNGS:  Clear to auscultation bilaterally.  HEART:  Regular rate and rhythm.  No murmurs, gallops, or rubs.  ABDOMEN:  Soft, nontender, and nondistended.  Positive bowel sounds.  EXTREMITIES:  Does have trace swelling in the dorsum of his left foot,  no other swelling.   Good pulses in all extremities.  SKIN:  He does have few healing erythematous bumps on his midchest.  Erythema in his left foot may have been spreading from between his  fourth and fifth toe on his left foot.  Erythema is very mild this time,  but does have some pain to palpation in the dorsum of his foot.  No  specific fluctuance and no significant pain to palpation, so this pain  does go above his ankle on the medial side but overall very minimal.  NEUROLOGIC:  No neurologic deficits noted.   Blood cultures x2 are still pending.   IMPRESSION:  This is a 40 year old white male with cellulitis of his  left foot.   ASSESSMENT/PLAN:  Cellulitis.  Given the response at this time, we will  continue on the vancomycin as well as the Rocephin.  He has had multiple  antibiotics started and is unclear exactly which one he has responded  to.  He tolerated the Keflex and  Septra then he was sent home without  any difficulty and may go back to those at the time of discharge.  He  does have improvement in his erythema and pain is not that significant.  We will check a CMET on him at this time.   DISPOSITION:  The patient was a patient of mine approximately 9 months  ago, but he turned out to have [employer] and was signed to the  [employer].  Since I was not accepting [place] access, the  patient was to find a new physician or go back to the Health Department.  There was some confusion over his admission yesterday and was admitted  to me, but we will continue to follow with the patient.  We will follow  with the patient on routine 30-day basis if needed, but since not taking  insurance essentially seen the patient for free.  We will attempt to  remedy this.  May need to discuss with the hospital as resuming his  care.      Catalina Pizza, M.D.  Electronically Signed     ZH/MEDQ  D:  04/28/2008  T:  04/28/2008  Job:  [id number]

## 2011-01-21 NOTE — Assessment & Plan Note (Signed)
NAME:  [name] [name], [name] [name]           CHART#:  [id number]   DATE:  10/01/2007                       DOB:  05/16/1971   REFERRING PHYSICIAN:  [facility].   PROBLEM LIST:  1. Intermittent abdominal pain and vomiting most likely secondary to a      functional gut disorder.  2. Bipolar disorder.  3. Borderline diabetes.  4. Allergy to penicillin.   SUBJECTIVE:  Mr. [name] [name] is a 40 year old male who presents as a  return patient visit.  He has been doing a lot better.  He is taking  mixed martial arts and eating a whole lot better.  He is working 2 jobs.  He complained of little stomach pain before he came to see me and  breaking out in a sweat.   MEDICATIONS:  1. Depakote.  2. Multivitamin.  3. Benzyl 20 mg t.i.d.  4. Prilosec b.i.d.  5. Vicodin as needed.  6. Lexapro 10 mg q.h.s.  7. Omega 3 2 daily.   OBJECTIVE:  VITALS:  Weight 197 pounds (down 12 pounds since 05/2008).  Height 5 feet 9 inches, temperature 97.9, blood pressure 132/80, pulse  88.  GENERAL:  No apparent distress.  Alert and oriented x4.  LUNGS:  Clear to auscultation bilaterally.  CARDIOVASCULAR:  Regular rhythm.  ABDOMEN:  Bowel sounds are present, soft, nontender, nondistended.   ASSESSMENT:  Mr. [name] [name] is a 40 year old male who is doing fairly  well.  His symptoms are controlled with omeprazole and Bentyl.  Thank  you for allowing me to see Mr. [name] [name] in consultation.  My  recommendations follow.   RECOMMENDATIONS:  1. Reduced Bentyl to twice daily.  He is given a #60 with 6 refills.  2. Continue Prilosec b.i.d. #60 refill x6.  3. He may use Tylenol or Aleve as needed for pain.  I did advise him      to avoid ibuprofen.  4. He has a follow-up appointment to see me  in 6 months.       Kassie Mends, M.D.  Electronically Signed     SM/MEDQ  D:  10/01/2007  T:  10/01/2007  Job:  [id number]

## 2011-01-21 NOTE — Assessment & Plan Note (Signed)
NAMEMarland Kitchen  [name], [name]           CHART#:  [id number]   DATE:  05/18/2007                       DOB:  [birth year]   PROBLEM LIST:  1. Intermittent abdominal pain and vomiting, most likely secondary to      functional gut disorder, but the differential diagnosis includes      microlithiasis, distal common bile duct stone, or low likelihood      sphincter of Oddi dysfunction.  2. Bipolar disorder.  3. Borderline diabetes.  4. Allergy to penicillin.   SUBJECTIVE:  [name] is a 40 year old male who was in his usual  state of health until approximately 2 days ago.  He has been working non-  stop at 2 jobs, since August 2.  He complains of feeling gassy and  bloated.  He has that watering sensation in the back of his throat that  he gets before he feels like he is going to vomit.  He can not sleep  when he lays down, because he feels sick and bloated.  His head is  swimming.  Prior to two days ago, he was sleeping okay.  He did not feel  stressed out.  Nobody else has the same systolic murmur.  He has  pressure and pain in his mid-abdomen.  He prefers Tylenol #3 to  hydrocodone, because it does not make him sleepy.  He got Tylenol #3 for  a tooth problem.  He denies any diarrhea.   OBJECTIVE:  VITAL SIGNS:  Weight 220 pounds (unchanged since June 2008).  Temperature 98.6, blood pressure 122/92, pulse 64.  GENERAL:  He is in no apparent distress, alert, oriented x4.  LUNGS:  Clear to auscultation bilaterally.  CARDIOVASCULAR:  Regular rhythm, normal S1 and S2.  ABDOMEN:  His abdomen and bowel sounds are present, soft, moderate  tenderness to palpation in the right upper and left upper quadrant,  without rebound or guarding.  Mild distention.  NEURO:  He has no focal neurologic deficits.   ASSESSMENT:  [name] is a 40 year old male with bloating, and  nausea which may be secondary to a viral illness or sphincter of Oddi  dysfunction, or a functional gut disorder.   Thank  you for allowing me to see [name] in consultation.  My  recommendations follow.   RECOMMENDATIONS:  1. He is to add a Probiotic daily.  2. He may recommend a digestive advantage, lactose intolerance.  3. He is to avoid caffeine and carbonated beverages.  He is not      allowed to chew any gum.  He is to avoid fatty foods and follow a      full liquid diet that does not contain lactose products such as      milk, until his symptoms resolve.  4. He has a follow-up appointment to see me in 3 months.  5. I will check a hepatic function panel and a lipase on today, to see      if he has any abnormalities.  He is also given a prescription for      Tylenol #3, #20 with one refill.  6. Will discuss need for endoscopic ultrasound at his next visit,      unless his liver enzymes are elevated.  He did not follow up with      his endoscopic ultrasound, due to financial reasons and starting  a      new job.       Kassie Mends, M.D.  Electronically Signed     SM/MEDQ  D:  05/18/2007  T:  05/19/2007  Job:  [id number]

## 2011-01-21 NOTE — Discharge Summary (Signed)
NAME:  CARTEZ, MOGLE          ACCOUNT NO.:  [id number]   MEDICAL RECORD NO.:  [id number]          PATIENT TYPE:  INP   LOCATION:  A332                          FACILITY:  [facility]   PHYSICIAN:  Catalina Pizza, M.D.        DATE OF BIRTH:  1970/11/24   DATE OF ADMISSION:  04/27/2008  DATE OF DISCHARGE:  08/23/2009LH                               DISCHARGE SUMMARY   DISCHARGE DIAGNOSES:  1. Cellulitis of left foot.  2. Irritable bowel syndrome.  3. Bipolar disorder.   DISCHARGE MEDICATIONS:  1. Depakote ER 2000 mg at bedtime.  2. Multivitamin once daily.  3. Celexa 20 mg once daily.  4. Omeprazole 20 mg twice daily.  5. Bactrim DS 160/800 b.i.d. for 10 days.  6. Percocet 5/325 mg q.4 h. p.r.n. unlikely will need much.   BRIEF HISTORY OF PRESENT ILLNESS:  Mr. Hackman is a 40 year old  gentleman who had a left foot swelling and pain, worsened over 2 days  and was seen 2 consecutive days in the emergency department for  reevaluation and noted that had worsening erythema and inflammation and  was admitted for IV antibiotics, unknown exact cause of his infection.  A mention of MRSA all of the chart, although nothing has ever been  confirmed related to MRSA.  He did not have any systemic bacteremia or  systemic issues related to infection, it is all localized.   PHYSICAL EXAMINATION ON DISCHARGE:  GENERAL:  This is a white male lying  in bed in no acute distress.  HEENT:  Unremarkable.  LUNGS:  Clear to auscultation bilaterally.  HEART:  Regular rate and rhythm.  No murmurs, gallops, or rubs.  ABDOMEN:  Soft, nontender, and nondistended.  Positive bowel sounds.  EXTREMITIES:  At this time, no lower extremity edema.  No erythema in  the left foot as before.  Healing open area between fourth and fifth  toes on the left foot.  NEUROLOGIC:  The patient is alert and oriented x3.  No deficits noted.   LABORATORY DATA:  Obtained during hospitalization, initial CBC showed  white count 8.4  and hemoglobin 14.6, at the time of discharge white  count of 6.8, hemoglobin of 14.7, and platelet count of 198.  Blood  cultures x2 were negative to date.  CMET initially shows sodium 141,  potassium 3.8, chloride 105, CO2 29, glucose 130, BUN 7, creatinine  0.87, total bili is 0.3, and alk phos 48.  SGOT 15, SGPT 15, total  protein of 5.7, albumin of 3.3, and calcium of 8.3.  BMET at the time of  discharge showed sodium 143, potassium 3.7, chloride 106, CO2 29,  glucose 89, BUN 6, creatinine of 0.72, and calcium of 8.5.   HOSPITAL COURSE:  Cellulitis.  The patient initially seen in the  emergency department and started on Rocephin and vancomycin, 1 dose  given and reevaluated next day, felt that they had progressed mildly on  the dorsum of his left foot and with erythema, swelling, and pain and so  was admitted for another 2 days of IV vancomycin and Rocephin.  Initially, he was given Bactrim  and Keflex, which he had taken 1 day of  each of these and feel that this will be adequate on starting back at  the time of discharge.  We will adjust resume on Bactrim twice a day for  the remainder of 9 days, and if any further signs of it coming back in  the next 3-4 days, then he is to resume the Keflex at that time and keep  elevated as much as possible for the next day or two.  He may return to  work in 2 days.  A note given for him for his hospitalization at this  time.   All other medical issues are stable.  We will continue on all of his  medicines as previously prescribed.   DISPOSITION:  As I told the patient that I do not take [place] access  and he is assigned to Health Department that the patient needs to seek  further medical attention from another physician.  The patient is aware  that if not been paid for any of visits previously he had at my office,  but we will be happy to see the patient for next 30 days if need be for  any other medical issues and the patient is aware of  this.      Catalina Pizza, M.D.  Electronically Signed     ZH/MEDQ  D:  04/30/2008  T:  05/01/2008  Job:  [id number]

## 2011-01-24 NOTE — H&P (Signed)
NAME:  [name] [name], [name] [name]          ACCOUNT NO.:  [id number]   MEDICAL RECORD NO.:  [id number]          PATIENT TYPE:  INP   LOCATION:  A340                          FACILITY:  [facility]   PHYSICIAN:  Margaretmary Dys, M.D.DATE OF BIRTH:  [birth year]   DATE OF ADMISSION:  07/01/2006  DATE OF DISCHARGE:  LH                                HISTORY & PHYSICAL   ADMITTING DIAGNOSES:  1. Acute abdominal pain.  2. Nausea and vomiting.  3. Pneumatosis intestinalis.  4. 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  History of  gas within the posterior wall of the stomach and      gas within the portal veins draining the stomach.   CHIEF COMPLAINT:  Recurrent abdominal pain.   HISTORY OF PRESENT ILLNESS:  [name] [name] is a 40 year old Caucasian male  who presented to the emergency room with abdominal pain which started  yesterday.  He reports that the onset has been gradual.  The patient reports  that he has had multiple episodes in the past, every two months, but he  decided to come into the hospital this time because the intensity was a  little more severe.  He reports that the location  is mostly in the  epigastric region radiating to the left flank.  He says it is mostly crampy,  occasionally sharp in nature and 8 out of 10 with no aggravating or  relieving factors.  The patient has also had some nausea and vomiting.  He  denies any fevers or chills.  The patient has not had any significant to his  surgery.   He was evaluated in the emergency room where a CT scan showed diffuse  abnormalities involving the abdomen with  no clear etiology.  The patient's  abdominal exam was also noted to be fairly benign.   The patient has now been admitted for further evaluation and management,  especially for GI consult in the morning.   REVIEW OF SYSTEMS:  Review of systems was really unremarkable.  He denies  any weight loss.  No diarrhea.  No fevers or chills.  No  skin rashes.   PAST MEDICAL HISTORY:  Bipolar disorder.   MEDICATIONS:  He is currently on:  1. Depakote 2000 mg by mouth every bedtime.  2. Strattera 60 mg by mouth every bedtime.  3. Rozerem 8 mg by mouth every bedtime.  4. Ranitidine once a day.  5. Flexeril by mouth once a day.   ALLERGIES:  HE HAS ALLERGIES TO PENICILLIN.   SOCIAL HISTORY:  The patient is married and has three children.  He works as  a Engineer, drilling.  He is a nonsmoker.  He says he drinks alcohol only  maybe once every week.  He denies any illicit drug use.   FAMILY HISTORY:  Family history is noncontributory.  The patient denies a  family history of hypertension, diabetes, or abdominal complaints.   PHYSICAL EXAMINATION:  GENERAL:  The patient was conscious, alert,  comfortable, and not in acute distress.  VITAL SIGNS:  Blood pressure 140/64, pulse of 97, respirations were 20,  temperature  97.4, oxygen saturation 98% on room air.  HEENT:  Normocephalic, atraumatic.  Oral mucosa was dry.  NECK:  Supple, no JVD.  LUNGS:  Clear clinically, good air entry bilaterally.  HEART:  S1/S2 regular.  No S3, gallops, or rubs.  ABDOMEN:  Abdomen was not distended, was soft.  There was no tenderness, no  guarding, no rigidity.  The patient said he felt a little bit of discomfort  on deep palpation in the epigastric area.  EXTREMITIES:  No pitting pedal edema.  No calf induration or tenderness was  noted.  CNS:  Grossly intact with no focal neurological deficits.   LABORATORY/DIAGNOSTIC DATA:  White blood cell count was 13.5, hemoglobin of  18.1, hematocrit 52.9, platelet count was 330,000, neutrophils were 86%.  Sodium 136, potassium 5.5, chloride of 101, CO2 is 25, glucose 170, BUN of  14, creatinine was 1.0.  Liver function tests were normal.  Calcium was 9.8,  lipase of 21.  Urinalysis was negative.   CT scan of the abdomen and pelvis shows emphysematous gastritis,  interstitial gastritis, and pneumatosis  gastritis.   ASSESSMENT/PLAN:  [name] [name] is a 40 year old Caucasian male who presents  to the emergency room with acute abdominal pain, nausea, and vomiting.  There is no real clear etiology.  The patient has a fairly abnormal CT scan.   The plan is to admit him at this time.  We will request GI consult in the  morning.  Based on my physical exam, I do not really see any indication for  surgical intervention at this time but will monitor him very closely for any  evidence of abdominal pain, hypotension, or hemodynamic instability.  Will  resume his other psychiatric medications but give him some clear liquids and  see how he does without advancing his diet until we are sure that his  abdominal process is not going to deteriorate.  I have discussed the above  plan with him, that we remain uncertain of the diagnosis and will be  involving the gastroenterologist service in the morning and he verbalized  full understanding.      Margaretmary Dys, M.D.  Electronically Signed     AM/MEDQ  D:  07/02/2006  T:  07/02/2006  Job:  [id number]

## 2011-01-24 NOTE — Op Note (Signed)
NAME:  [name] [name], [name] [name]          ACCOUNT NO.:  [id number]   MEDICAL RECORD NO.:  [id number]          PATIENT TYPE:  INP   LOCATION:  A318                          FACILITY:  [facility]   PHYSICIAN:  R. Roetta Sessions, M.D. DATE OF BIRTH:  1971-09-04   DATE OF PROCEDURE:  07/10/2006  DATE OF DISCHARGE:  07/11/2006                                 OPERATIVE REPORT   PROCEDURE PERFORMED:  Esophagogastroduodenoscopy with biopsy.   ENDOSCOPIST:  Jonathon Bellows, M.D.   INDICATIONS FOR THE PROCEDURE:  The patient is a 40 year old gentleman with  recurrent nausea, vomiting, upper abdominal pain and diarrhea. He had been  admitted to the hospital for further evaluation today; however, since being  admitted yesterday his symptoms have pretty much resolved.  He has had some  diarrhea, but the abdominal pain and nausea have completely resolved; and,  he is interested in eating. He had marked abnormalities on EGD previously.  EGD is now being done to reassess the upper GI tract so that we can get more  information with regards to his illness.   The procedure was discussed with the patient at length.  Potential risks,  benefits and alternatives have been reviewed and questions were answered.  He is agreeable.  Please see documentation in the medical record of the  procedure note.   DESCRIPTION OF THE PROCEDURE:  Oxygen saturation, blood pressure, pulse and  respirations were monitored throughout the entire. Conscious sedation the IV  Versed and Demerol was given in incremental doses.  Cetacaine spray was give  for topical pharyngeal anesthesia.   FINDINGS:  Esophagus:  Esophagogastroduodenoscopy of the tubular esophagus  revealed no mucosal abnormalities.  The esophagogastric junction was easily  traversed.   Stomach:  The stomach was emptied and insufflated very well the air.  Thorough examination  the gastric mucosa was done and retroflexion in the  proximal stomach and esophagogastric  junction demonstrated a couple areas of  patches of erythema and superficial erosions around the fundal mucosa.  There were two 3 mm prepyloric ulcers; however, the gastric mucosa otherwise  appeared entirely normal.   Pylorus: The pylorus was patent and was easily traversed. The duodenal bulb,  second and third portions revealed no abnormalities.   THERAPEUTIC/DIAGNOSTIC MANEUVERS:  1. Small bowel biopsy; it was biopsied to rule out villous adenoma.  2. The antral ulcers were biopsied subsequently separately.  3. Finally the areas of fundal erythema and erosions were biopsied.   The patient tolerated the procedure very well and was reactive after  endoscopy.   IMPRESSION:  1. Normal esophagus with patchy areas of fundal erythema and superficial      erosion.  2. Two small prepyloric ulcers all of which had a benign appearance status      post biopsy; patent pylorus.  3. Duodenum normal 1 though 3; status post biopsies of second portion of      the duodenum and third portion of the duodenum.   DISCUSSION:  There is seen a tremendous difference in endoscopic findings  today as opposed to when Dr. Cira Servant performed esophagogastroduodenoscopy  recently as compared to the dictated  report and the photographs previously,  which were reviewed.  There is documented improvement in the picture of the  gastric mucosa.  There is nothing in the stomach that is concerning for a  lymphoma or a carcinoma. Given how his abdominal pain, nausea and vomiting  can almost turn off and on fairly suddenly, I would be more inclined that he  may have been experiencing  intermittent gastric torsion and/or volvulus to  account for his symptoms.  The abnormalities seen on upper  esophagogastroduodenoscopy may be more-or-less related to vascular  congestion.   RECOMMENDATIONS:  1. We will go ahead and attempt to get an upper GI and small bowel follow      through  today to get a road map of his upper  gastrointestinal tract.  2. If his volvulus reduced on its on then I would not expect to see      abnormalities.  3. If it is negative we will go ahead and advance the diet follow up on      biopsies, and hopefully get him home  in the next day or two.  4. However, if he has recurrent symptoms I would most likely bring him      back for an urgent barium contrast study of his upper gastrointestinal      tract to see we can document the process.  5. I have also discussed the case again with Dr. Lovell Sheehan who saw him      previously and he agrees with the plan.      Jonathon Bellows, M.D.  Electronically Signed     RMR/MEDQ  D:  07/10/2006  T:  07/11/2006  Job:  [id number]   cc:   Della Goo, M.D.  Fax: [phone]

## 2011-01-24 NOTE — Op Note (Signed)
NAME:  [name] [name], [name] [name]          ACCOUNT NO.:  [id number]   MEDICAL RECORD NO.:  [id number]          PATIENT TYPE:  INP   LOCATION:  A340                          FACILITY:  [facility]   PHYSICIAN:  Kassie Mends, M.D.      DATE OF BIRTH:  March 06, 1971   DATE OF PROCEDURE:  07/03/2006  DATE OF DISCHARGE:  07/04/2006                                  PROCEDURE NOTE   PROCEDURE:  Esophagogastroduodenoscopy with cold forceps biopsies.   INDICATIONS FOR EXAM:  [name] is a 40 year old male with  emphysematous gastritis.  He presented with acute onset of diarrhea followed  by nausea and vomiting for two days.   FINDINGS:  1. Erythema, edema, and hemorrhage in the cardia likely secondary to      vomiting and retching.  2. Erythema and edema in the antrum as well as a dusky appearance to the      mucosa consistent with gastritis and ischemia.  3. Normal esophagus without evidence of Barrett's.  4. Normal duodenum.   RECOMMENDATIONS:  1. General surgery consult.  2. CT scan of the abdomen with and without IV contrast.  Check ANA and C-      reactive protein.  3. Follow up biopsies obtained via cold forceps.   PROCEDURE TECHNIQUE:  Physical exam was performed and informed consent was  obtained per the patient after explaining the benefits, risks, and  alternatives to the procedure.  The patient was connected to the monitor and  placed in the left lateral position.  Continuous oxygen was supplied by  nasal cannula and IV medicines administered through an indwelling cannula.  After administration of sedation, the patient's esophagus was intubated and  the scope advanced under direct visualization to the second portion of the  duodenum using minimal insufflation.  The scope was slowly removed carefully  examining the color, texture, and anatomy of the mucosa on the way out.  The  patient was recovered in the endoscopy suite and discharged home in  satisfactory condition.      Kassie Mends, M.D.  Electronically Signed     SM/MEDQ  D:  07/04/2006  T:  07/04/2006  Job:  [id number]

## 2011-01-24 NOTE — H&P (Signed)
NAME:  [name] [name], [name] [name]          ACCOUNT NO.:  [id number]   MEDICAL RECORD NO.:  [id number]          PATIENT TYPE:  INP   LOCATION:  A318                          FACILITY:  [facility]   PHYSICIAN:  R. Roetta Sessions, M.D. DATE OF BIRTH:  Dec 19, 1970   DATE OF ADMISSION:  07/09/2006  DATE OF DISCHARGE:  LH                                HISTORY & PHYSICAL   REASON FOR ADMISSION:  Abdominal pain, nausea, and vomiting.   ADMITTING DIAGNOSES:  1. Recurrent abdominal pain, nausea, and vomiting.  2. History of abnormal esophagogastroduodenoscopy, with gastritis, and      abnormal gastric wall.  3. History of abnormal computed tomography, with air within the gastric      wall and venous drainage.   HISTORY OF PRESENT ILLNESS:  The patient is a 40 year old Caucasian  gentleman with recurrent abdominal pain associated with nausea, vomiting,  and diarrhea for six months' duration.  Symptoms have been occurring about  once every couple of months and usually last two to three days at a time.  He was recently admitted on July 02, 2006 for similar symptoms and  discharged on July 04, 2006.  During that hospitalization, he had a CT  which revealed air within the posterior gastric wall and within the portal  venous drainage and gastric venous drainage.  This led to an EGD by Dr.  Kassie Mends which revealed erythema and edema and hemorrhage in the cardia  felt to be due to vomiting and retching, and erythema with edema in the  antrum, with dusky appearance, consistent with a gastritis or ischemia.  The  patient had surgical consultation by Dr. Franky Macho, and it was felt that  there was no surgical process evolving.  He had a repeat CT two days later  which showed resolution of air within the gastric wall and venous drainage.  The patient clinically improved and was sent home.  He did not go on PPI  therapy.  He continues to take Advil several times a week.  He states he did  well for three to  four days, and this morning he woke up at 3 a.m. with  severe abdominal pain and nausea and vomiting which was uncontrollable.  He  called Dr. Cira Servant, who admitted him to the hospital.  During his previous  hospitalization, he also had a CRP which was elevated at 28.9.  His ANA was  weekly positive at 1:40 titer, which was speckled.  Today, he has had an  acute abdominal series which revealed persistent right basilar atelectasis  and mild bronchitic changes, but no acute intraabdominal process.  No free  air was seen.  His amylase was 42, lipase 24, magnesium 2.1.  MET-7 normal,  except for glucose of 140.  LFTs normal.  White count 11,700, hemoglobin  15.7, hematocrit 263,000.  He is afebrile.  He has had several episodes of  vomiting since he has been on the floor.   MEDICATIONS AT HOME:  1. Depakote 2 g at bedtime.  2. Strattera 60 mg at bedtime.  3. Rozerem 8 mg at bedtime.  4. Phenergan p.r.n.  5.  Advil 600 mg p.r.n., usually 2 times per week.  6. Multivitamin daily.   ALLERGIES:  PENICILLIN causes hives.   PAST MEDICAL HISTORY:  1. Bipolar disorder.  2. Borderline diabetes.  3. Recent hospitalization, as outlined above.   PAST SURGICAL HISTORY:  1. None.  2. Recent EGD, as outlined above.   FAMILY HISTORY:  Father has had peptic ulcer disease.  No family history of  colorectal cancer.   SOCIAL HISTORY:  He is married.  Has one child with his current wife, and  two other children.  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  He does not smoke.  No drug use.  He consumes alcohol  only once a month, usually consisting of only a couple of drinks.   REVIEW OF SYSTEMS:  See HPI for GI.  CONSTITUTIONAL:  No weight loss.  CARDIOPULMONARY:  No chest pain or shortness of breath.   PHYSICAL EXAMINATION:  VITAL SIGNS:  Temperature 98.6, pulse 99,  respirations, 16, blood pressure 127/77, height 69 inches, weight 89.8 kg.  GENERAL:  Pleasant young  mildly obese Caucasian male who is resting  comfortably.  SKIN:  Warm and dry, no jaundice.  HEENT:  Sclerae anicteric.  Oropharyngeal mucosa moist and pink.  CHEST:  Lungs are clear to auscultation.  CARDIAC:  Reveals regular rate and rhythm.  Normal S1, S2.  No murmurs,  rubs, or gallops.  ABDOMEN:  Positive bowel sounds.  Slightly obese but symmetrical, soft.  There is moderate epigastric tenderness to deep palpation.  No organomegaly  or masses.  No rebound tenderness or guarding.  No abdominal bruits or  hernias.  EXTREMITIES:  No edema.   LABORATORIES:  As mentioned in HPI.  In addition, total bilirubin 0.9,  alkaline phosphatase 61, AST 21, ALT 27, albumin 4.1, sodium 137, potassium  3.7, BUN 14, creatinine 0.7, platelets 263,000.   IMPRESSION:  The patient is a 40 year old gentleman with a six-month history  of intermittent severe epigastric pain associated with nausea and vomiting  and loose stools.  This is his second admission since July 02, 2006.  During his last admission, he had a very abnormal CT with portal venous gas  and posterior gastric wall gas, with gas in the veins draining from the  stomach.  He had leukocytosis.  On EGD, he had a very abnormal gastric wall,  as outlined above.  Repeat CT revealed resolution of air within the gastric  wall.  Now, the patient presents with recurrent symptoms.  Etiology of  abnormal EGD findings unclear.  Question severe gastritis versus ischemic  process.  Will question possibility of gastric volvulus or torsion requiring  episodic symptoms.  He also has a very markedly elevated CRP.  It is unclear  of the significance of the weakly positive ANA at this point.   RECOMMENDATIONS:  1. Admission for supportive measures.  He will be given Phenergan, IV      fluids, remain n.p.o. except for ice chips and medications.  2. Will repeat CBC in the morning, to follow his white count. 3. He will receive IV Protonix 40 mg q.12 h.   Continue home medications,      including Depakote, Strattera, and      Rozerem.  Dilaudid 1 mg IV q.6 h. p.r.n. pain.  4. Will follow up pending labs, including TSH, cortisol level, and      glycosylated hemoglobin.  5. Consider possibility of repeat upper endoscopy after discussion with  Dr. Jena Gauss.      Tana Coast, P.AJonathon Bellows, M.D.  Electronically Signed    LL/MEDQ  D:  07/09/2006  T:  07/10/2006  Job:  [id number]

## 2011-01-24 NOTE — Discharge Summary (Signed)
NAME:  [name] [name], [name] [name]          ACCOUNT NO.:  [id number]   MEDICAL RECORD NO.:  [id number]          PATIENT TYPE:  INP   LOCATION:  A318                          FACILITY:  [facility]   PHYSICIAN:  R. Roetta Sessions, M.D. DATE OF BIRTH:  13-Jul-1971   DATE OF ADMISSION:  07/09/2006  DATE OF DISCHARGE:  11/03/2007LH                                 DISCHARGE SUMMARY   DISCHARGE DIAGNOSES:  1. Recurrent self-limiting upper abdominal pain, nausea and vomiting,      suspect gastric volvulus/torsion but not yet proven.  2. 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  3. Gastritis and peptic ulcer disease on esophagogastroduodenoscopy.  4. History of recent glucose intolerance.   SECONDARY DIAGNOSES:  1. Bipolar disorder.  2. Glucose intolerance.  3. History of a weakly positive ANA.   PROCEDURE PERFORMED IN THE HOSPITAL:  1. EGD by Dr. Jena Gauss on July 10, 2006.  2. Upper small bowel follow-through on July 10, 2006.   DISCHARGE MEDICATIONS:  1. Depakote 2 gm at bedtime.  2. Strattera 60 mg at bedtime.  3. Rozarem 8 mg at bedtime.  4. Phenergan 25 mg tablet 1 every 8 hours as needed p.r.n. nausea.  5. Multivitamin daily.  6. AcipHex 20 mg orally daily.   Patient does not take any Advil or other nonsteroidals.   DISCHARGE DIET:  Low fat.   DISCHARGE ACTIVITY:  Gradually resume normal activity.   DISCHARGE FOLLOWUP:  Dr. Kassie Mends July 14, 2006 at 11:00 a.m.   HOSPITAL COURSE:  Mr. [name] [name] is a pleasant 40 year old gentleman  with recent episodes of recurrent nausea, vomiting, upper abdominal pain.  Recent CT scan demonstrated Pneumocystis intestinalis, which resolved on  subsequent CT scan.  Prior EGD demonstrated marked acute inflammatory  changes of the stomach.  He was being evaluated as an outpatient when he  developed recurrent symptoms and was admitted directly on July 09, 2006.  Acute abdominal series on admission demonstrated no  acute findings.  There  were some nonspecific air/fluid levels.  There was no bowel wall thickening,  free air, or pneumatosis.  Prior biopsies by Dr. Cira Servant demonstrated only  gastritis.   He had been seen by Dr. Lovell Sheehan previously, and surgical intervention was  not felt to be warranted.   On admission, his white count was 11.7.  That came down to 6.5 on July 10, 2006.  His H&H remained okay at 15.7 and 45 on admission, 14 and 42,  respectively on July 10, 2006.  His blood sugar was elevated on admission  at 140.  His LFTs were completely normal on admission with AST 21, ALT 27,  ALP 61, total bilirubin 0.9, magnesium 2.1, amylase 42, lipase 24.   This gentleman was admitted to the hospital and treated with IV fluids,  proton pump inhibitor therapy, and antiemetic therapy in the way of Zofran  because of the marked inflammatory changes seen on recent EGD.  I elected to  repeat the EGD.  This was done yesterday.  It was notable that within 24  hours of admission, this gentleman's GI symptoms __________ resolved.  EGD  yesterday demonstrated only some patchy erythema with superficial erosions  in the fundus and two small prepyloric benign-appearing antral ulcers.  Inflammatory changes described on the prior EGD were not apparent.  On the  repeat EGD, biopsies of the fundal mucosal inflammation were taken.  Biopsies of the prepyloric antral ulcers were also taken.   The patient remained asymptomatic for the subsequent 24 hours of admission.  He was tolerating a diet, having normal bowel movements, (although he did  have a history of chronic diarrhea, for which I did biopsy a small bowel,  not mentioned above).  I consulted Dr. Lovell Sheehan informally with the clinical  scenario as it has unfolded.   I suspect this gentleman has had a experience of recurrent partial gastric  volvulus/torsion, which has been self-limiting.  This would explain the  marked inflammatory changes seen  recently with a subsequent dramatic  improvement on serial EGD.  This partial torsion or volvulus, self-limiting,  would also explain his relative acute onset of and relatively rapid  resolution of his symptoms recently.   On further discussion with Dr. Lovell Sheehan, it was felt that gastric  volvulus/torsion needed to be documented before surgical intervention for  gastric PEG __________ was entertained.   On the day of discharge, the patient had no GI symptoms, was afebrile, had  stable, normal vital signs.  He was advised not to take any Advil.  Biopsies  are pending at the time of discharge.   H. pylori serologies are also pending at the time of discharge.   Patient will go home on AcipHex 20 mg orally daily.  He will follow up with  Dr. Kassie Mends, as scheduled, next week.  If he has recurrent symptoms, the  plan would be to try to get a barium upper GI series on a STAT basis.      Jonathon Bellows, M.D.  Electronically Signed     RMR/MEDQ  D:  07/11/2006  T:  07/11/2006  Job:  [id number]

## 2011-01-24 NOTE — Discharge Summary (Signed)
NAME:  [name] [name], [name] [name]          ACCOUNT NO.:  [id number]   MEDICAL RECORD NO.:  [id number]          PATIENT TYPE:  INP   LOCATION:  A340                          FACILITY:  [facility]   PHYSICIAN:  Margaretmary Dys, M.D.DATE OF BIRTH:  1971-08-14   DATE OF ADMISSION:  07/01/2006  DATE OF DISCHARGE:  10/27/2007LH                                 DISCHARGE SUMMARY   DISCHARGE DIAGNOSES:  1. Acute abdominal pain, resolved.  2. 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  Awaiting      biopsy report from an EGD .   DIET:  Regular.  The patient is to avoid wheat for now,he is  being  evaluated for possible wheat allergy by Dr. Cira Servant.   ACTIVITY:  As tolerated.   DISCHARGE MEDICATIONS:  1. Depakote 2000 mg p.o. at bedtime.  2. Strattera 60 mg p.o. at bedtime.  3. Rozerem 8 mg p.o. every bedtime.  4. Ranitidine once a day.  5. Flexeril by mouth once a day.   FOLLOW-UP:  Follow up with Dr. Cira Servant in the next 3-4 weeks.   OTHER MEDICAL HISTORY OF NOTE:  He has bipolar disorder.   PERTINENT LABORATORY DATA:  On admission, white blood cell count was 13.5,  hemoglobin of 18.1, hematocrit was 52.9, platelet count was 330, neutrophils  of 86%.  Sodium was 136, potassium 5.5, chloride of 101, CO2 25, glucose  170, BUN of 14, creatinine of 1.0, calcium 9.8, lipase of 21.  UA was  negative.  CT scan of the abdomen and pelvis shows emphysematous gastritis,  interstitial gastritis and pneumatosis gastritis.  The exact etiology is  unclear.   CONSULTATIONS OBTAINED:  1. Dalia Heading, M.D., general surgery.  He did not see an indication      for surgery at this time.  2. Kassie Mends, M.D., GI, who performed an EGD on Friday, July 03, 2006.  The official report is pending.  The biopsies that were taken as      a result are also pending.   HOSPITAL COURSE:  [name] [name] is a 40 year old Caucasian  male who was  admitted through the emergency room with acute abdominal pain.  The patient  reports that in the past 6 months he has had episodes of this going on every  other month but decided to come into the emergency room this time because of  its persistence.   He reports mostly epigastric pain radiating to his left flank with some  nausea and vomiting and occasional diarrhea.  He denies any fevers or  chills.  He denied any headaches or dizziness.  The patient received  multiple pain medications in the emergency room without any improvement;  however, a CT scan done raised concern for an abdominal pathology as  mentioned above.  The patient was subsequently admitted.  He was seen by Dr.  Franky Macho, a general surgeon, who did not think it was an ischemic bowel.  He was also seen by Dr. Cira Servant of GI, who raised concern of  possible allergy  to food and possibly a viral infection.  The patient had an EGD with  biopsies pending.  He will follow up with these results.  On July 04, 2006, when I saw him the patient was doing much better and his diet has been  advanced.  The patient had no difficulty keeping the food down.  He is being  discharged home in a satisfactory condition.      Margaretmary Dys, M.D.  Electronically Signed     AM/MEDQ  D:  07/04/2006  T:  07/04/2006  Job:  [id number]

## 2011-01-24 NOTE — Consult Note (Signed)
NAME:  [name] [name], BALABAN          ACCOUNT NO.:  [id number]   MEDICAL RECORD NO.:  [id number]          PATIENT TYPE:  INP   LOCATION:  A340                          FACILITY:  [facility]   PHYSICIAN:  Kassie Mends, M.D.      DATE OF BIRTH:  1970/11/02   DATE OF CONSULTATION:  07/02/2006  DATE OF DISCHARGE:                                   CONSULTATION   REASON FOR CONSULTATION:  Acute abdominal pain.   HISTORY OF PRESENT ILLNESS:  The patient is a 40 year old Caucasian  gentleman with recurrent abdominal pain associated with nausea, vomiting,  and diarrhea for six months' duration. Symptoms usually occur once every  couple of months. They may last for 2-3 days at a time. Current episode  began about 36 hours ago and actually woke him up from sleep. He complains  of severe epigastric pain. He has had numerous episodes of nausea, vomiting,  and dry heaves in the last 24 hours. Denies any hematemesis. He also has  loose stools but no melena or rectal bleeding. In between these episodes he  feels fine. His bowel movements are normal, and he has no problems with  abdominal pain. He has had a good appetite. He has actually gained some  weight over the last six months about 25 pounds. He contributes this to the  fact that he eats throughout the day and often gets up a couple of times at  night with his son and eats in as well. Denies any dysuria, hematuria. He  states he is borderline diabetic. He takes Advil for aches and pains usually  a couple of times a week. May take 400 to 600 mg at a time. No other NSAIDs  or aspirin. He consumes alcohol only once a month; usually consistently of a  couple of drinks.   MEDICATIONS AT HOME:  1. Depakote 200 mg daily.  2. Strattera 60 mg daily.  3. Rozerem 8 mg daily.  4. Multivitamin daily.  5. Advil 600 mg p.r.n. usually two times per week.   ALLERGIES:  PENICILLIN causes hives.   PAST MEDICAL HISTORY:  Bipolar disorder, borderline diabetes.   PAST SURGICAL HISTORY:  None.   FAMILY HISTORY:  His father has had peptic ulcer disease. No family history  of colorectal cancer.   SOCIAL HISTORY:  He is married. He has one child with his current wife and  two other children. 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 He does not smoke. No drug use.   REVIEW OF SYSTEMS:  See HPI for GI, GU, CONSTITUTIONAL. CARDIOPULMONARY:  No  chest pain or shortness of breath.   PHYSICAL EXAMINATION:  VITAL SIGNS:  T current 97.9, T max 99, pulse 91,  respirations 20, blood pressure 108/58, weight 95.1 kg, height 69 inches.  GENERAL:  A pleasant young mildly obese Caucasian male in no acute distress.  SKIN:  Warm and dry, no jaundice.  HEENT:  Sclerae anicteric, oropharyngeal mucosa moist and pink.  CHEST:  Lungs clear to auscultation.  CARDIAC:  Reveals regular rate and rhythm, normal S1, S2. No murmurs, rubs  or gallops.  ABDOMEN:  Positive bowel sounds, slightly obese but symmetrical, soft. He  has moderate epigastric tenderness to deep palpation. No organomegaly or  masses. No rebound, tenderness or guarding. No abdominal hernias.  EXTREMITIES:  No edema.   LABORATORIES ON ADMISSION:  White count 13,500. It is 8300 today. Hemoglobin  18.1, is 14.2 today. Platelets 309,000. Sodium 138, potassium 5.5 down to  3.9, BUN 15, creatinine 0.8, glucose 170, now it is 104. Total bilirubin  0.7, alkaline phosphatase 97. AST 22, ALT 31. Lipase 12. Albumin 5.1.   CT of the abdomen and pelvis revealed portal venous gas with gas in the  posterior wall of the stomach and gases within veins draining the stomach.  No free intraperitoneal air, fluid or abscess noted.   IMPRESSION:  The patient is a 40 year old gentleman with a 42-month history  of intermittent severe epigastric pain associated with nausea, vomiting, and  loose stools. In between these episodes he is completely asymptomatic. CT is  abnormal with portal venous  gas and posterior gastric wall gas with gas in  the veins draining from the stomach. He has leukocytosis. Differential  diagnosis includes emphysematous gastritis, interstitial gastritis,  pneumatosis cystoides. There is no evidence of free intraperitoneal air  fluid or abscess. Differential diagnosis for recurrent symptoms includes  celiac sprue, eosinophilic gastroenteritis, food borne illness.   RECOMMENDATIONS:  1. Will start IV Protonix.  2. NPO except ice chips.  3. EGD with biopsies on Friday. Will await stool studies      Tana Coast, P.A.      Kassie Mends, M.D.  Electronically Signed    LL/MEDQ  D:  07/02/2006  T:  07/02/2006  Job:  [id number]

## 2011-02-04 ENCOUNTER — Emergency Department: Payer: Self-pay | Admitting: Emergency Medicine

## 2011-03-22 ENCOUNTER — Emergency Department: Payer: Self-pay | Admitting: *Deleted

## 2011-03-28 ENCOUNTER — Ambulatory Visit: Payer: Self-pay | Admitting: Internal Medicine

## 2011-04-05 ENCOUNTER — Emergency Department: Payer: Self-pay | Admitting: Internal Medicine

## 2011-05-07 ENCOUNTER — Other Ambulatory Visit: Payer: Self-pay | Admitting: Internal Medicine

## 2011-06-09 ENCOUNTER — Telehealth: Payer: Self-pay | Admitting: Internal Medicine
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
## 2011-06-10 NOTE — Telephone Encounter (Signed)
Notified patient he will need to make an appt per Dr. Darrick Huntsman before any pain meds are prescribed.

## 2011-06-12 ENCOUNTER — Ambulatory Visit (INDEPENDENT_AMBULATORY_CARE_PROVIDER_SITE_OTHER): Payer: Self-pay | Admitting: Internal Medicine

## 2011-06-12 ENCOUNTER — Encounter: Payer: Self-pay | Admitting: Internal Medicine

## 2011-06-12 VITALS — BP 116/80 | HR 92 | Temp 97.9°F | Resp 16 | Ht 69.5 in | Wt 188.8 lb

## 2011-06-12 DIAGNOSIS — I10 Essential (primary) hypertension: Secondary | ICD-10-CM | POA: Insufficient documentation

## 2011-06-12 DIAGNOSIS — M549 Dorsalgia, unspecified: Secondary | ICD-10-CM

## 2011-06-12 DIAGNOSIS — F419 Anxiety disorder, unspecified: Secondary | ICD-10-CM | POA: Insufficient documentation

## 2011-06-12 MED ORDER — NABUMETONE 750 MG PO TABS: 750.0000 mg | ORAL_TABLET | Freq: Two times a day (BID) | ORAL | Status: DC

## 2011-06-12 MED ORDER — TRAMADOL HCL 50 MG PO TABS: 50.0000 mg | ORAL_TABLET | Freq: Four times a day (QID) | ORAL | Status: AC | PRN

## 2011-06-12 MED ORDER — HYDROCODONE-ACETAMINOPHEN 7.5-750 MG PO TABS: 1.0000 | ORAL_TABLET | Freq: Every evening | ORAL | Status: DC | PRN

## 2011-06-12 MED ORDER — HYDROCODONE-ACETAMINOPHEN 5-500 MG PO TABS: ORAL_TABLET | ORAL | Status: DC
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
## 2011-06-12 NOTE — Patient Instructions (Signed)
I am referring you for a Physical Therapy evaluation. I am prescribing relafen (nabumetone) twice dialy as an anitinflammatory to replace the toradol.   continue tramadol during the day and you can use vicodin at bedtime

## 2011-06-12 NOTE — Assessment & Plan Note (Signed)
Controlled on current dose of lisinopril.  No changes today

## 2011-06-29 ENCOUNTER — Emergency Department: Payer: Self-pay | Admitting: Unknown Physician Specialty

## 2011-07-02 ENCOUNTER — Encounter: Payer: Self-pay | Admitting: Internal Medicine

## 2011-07-10 ENCOUNTER — Telehealth: Payer: Self-pay | Admitting: Internal Medicine

## 2011-07-10 ENCOUNTER — Encounter: Payer: Self-pay | Admitting: Internal Medicine

## 2011-07-10 DIAGNOSIS — M549 Dorsalgia, unspecified: Secondary | ICD-10-CM

## 2011-07-10 NOTE — Telephone Encounter (Signed)
It is not the vicodin treaign up his stomach, it is probably the relafen.  We can refill the vicodin but I will not prescribe anything stronger when his x rays show no disk problesm.

## 2011-07-10 NOTE — Telephone Encounter (Signed)
You can give him a muscle relaxer,  Robaxin 750 mg once or twice daily  #60 and #60 vicodin one tablet every 6 hours as needed   , no refills.  He will need to get an MRIto investigate the source of his pain  if his pain continues to be severe enough to require continuous refill on the narcotics.

## 2011-07-10 NOTE — Telephone Encounter (Signed)
Patient called this morning and stated you referred him to PT and you had offered him a stronger pain medication than the Vicodin 5-500 mg at his office visit but he declined it.  He stated with going to PT he thinks he needs the stronger dose now, he also stated the 5-500 mg is tearing up his stomach.  Please advise.

## 2011-07-10 NOTE — Telephone Encounter (Signed)
[phone] Pt called wants meds refilled  The med is Vicodin 7.5 he said dr Darrick Huntsman wanted to perscribe this to him the last time he was here.  The vicodin 5/500 is to hard on this stomach this is causing diarrhea stomach cramps. [facility] [address]   [phone]

## 2011-07-11 MED ORDER — METHOCARBAMOL 750 MG PO TABS: ORAL_TABLET | ORAL | Status: DC

## 2011-07-11 MED ORDER — HYDROCODONE-ACETAMINOPHEN 5-500 MG PO TABS: 1.0000 | ORAL_TABLET | Freq: Four times a day (QID) | ORAL | Status: DC | PRN
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
## 2011-07-11 NOTE — Telephone Encounter (Signed)
Notified patient of message, I have called in both Rxs.  Patient wants to go ahead and get the MRI.  Please advise.

## 2011-07-14 ENCOUNTER — Telehealth: Payer: Self-pay | Admitting: Internal Medicine

## 2011-07-14 NOTE — Telephone Encounter (Signed)
Patient called and stated he has his MRI scheduled for tomorrow and wanted to know if you wanted him to continue with PT or wait until you get the results back.   Please advise.

## 2011-07-14 NOTE — Telephone Encounter (Signed)
Patient called back and was notified.

## 2011-07-14 NOTE — Telephone Encounter (Signed)
Wait until mri si done

## 2011-07-14 NOTE — Telephone Encounter (Signed)
Patient's number has been disconnected. Left message with Patient's mother to have him call the office.

## 2011-07-17 ENCOUNTER — Telehealth: Payer: Self-pay | Admitting: Internal Medicine

## 2011-07-17 NOTE — Telephone Encounter (Signed)
I received a notice from radiology that patient skipped his MRI without calling.   I will no longer be prescribing any narcotics for this patient.

## 2011-07-18 NOTE — Telephone Encounter (Signed)
I looked in the referral bin and noticed that this had been schedule and I also tried calling patient to follow up but his number has been disconnected.

## 2011-07-18 NOTE — Telephone Encounter (Signed)
Left message asking patient to return my call.

## 2011-07-20 ENCOUNTER — Ambulatory Visit: Payer: Self-pay | Admitting: Internal Medicine

## 2011-07-21 ENCOUNTER — Telehealth: Payer: Self-pay | Admitting: Internal Medicine

## 2011-07-21 NOTE — Telephone Encounter (Signed)
Patient called and left a voicemail and stated he rescheduled his MRI and had it done yesterday.  Please advise when you see the results.

## 2011-07-23 ENCOUNTER — Telehealth: Payer: Self-pay | Admitting: Internal Medicine

## 2011-07-23 NOTE — Telephone Encounter (Signed)
HIs lumbar MRI is completely normal.  contineu PT.  No more refills on narcotics if he asks.

## 2011-07-23 NOTE — Telephone Encounter (Signed)
Left message asking patient to return my call.

## 2011-07-24 NOTE — Telephone Encounter (Signed)
Patient notified of results.

## 2011-07-30 ENCOUNTER — Encounter: Payer: Self-pay | Admitting: Internal Medicine

## 2011-08-28 ENCOUNTER — Telehealth: Payer: Self-pay | Admitting: Internal Medicine

## 2011-08-28 NOTE — Telephone Encounter (Signed)
error 

## 2011-09-04 ENCOUNTER — Encounter: Payer: Self-pay | Admitting: Internal Medicine

## 2011-09-04 ENCOUNTER — Ambulatory Visit (INDEPENDENT_AMBULATORY_CARE_PROVIDER_SITE_OTHER): Payer: Self-pay | Admitting: Internal Medicine

## 2011-09-04 VITALS — BP 122/84 | HR 80 | Temp 98.5°F | Wt 195.5 lb

## 2011-09-04 DIAGNOSIS — M549 Dorsalgia, unspecified: Secondary | ICD-10-CM

## 2011-09-04 DIAGNOSIS — M545 Low back pain, unspecified: Secondary | ICD-10-CM

## 2011-09-04 DIAGNOSIS — I10 Essential (primary) hypertension: Secondary | ICD-10-CM

## 2011-09-04 MED ORDER — TRAMADOL HCL 50 MG PO TABS: 50.0000 mg | ORAL_TABLET | Freq: Four times a day (QID) | ORAL | Status: AC | PRN

## 2011-09-04 MED ORDER — CYCLOBENZAPRINE HCL 5 MG PO TABS: 5.0000 mg | ORAL_TABLET | Freq: Three times a day (TID) | ORAL | Status: AC | PRN

## 2011-09-04 MED ORDER — HYDROCODONE-ACETAMINOPHEN 5-500 MG PO TABS: 1.0000 | ORAL_TABLET | Freq: Every day | ORAL | Status: DC | PRN

## 2011-09-04 MED ORDER — DICLOFENAC SODIUM 75 MG PO TBEC: 75.0000 mg | DELAYED_RELEASE_TABLET | Freq: Two times a day (BID) | ORAL | Status: AC

## 2011-09-04 NOTE — Assessment & Plan Note (Signed)
He has achieved maximal benefit with PT in the setting of normal MRI and  Xrays.  He will use tramadol and flexeril and diclofenac for management of chronic low back pain .  #30 vicodin given for acute flares.,  NO refills.
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
## 2011-09-04 NOTE — Assessment & Plan Note (Signed)
He has discontinued his antihypertensives over 6 weeks ago and remains normotensive.  Return in 6 months.

## 2011-10-14 ENCOUNTER — Other Ambulatory Visit: Payer: Self-pay | Admitting: Internal Medicine

## 2011-10-18 ENCOUNTER — Emergency Department: Payer: Self-pay | Admitting: Emergency Medicine

## 2012-02-24 ENCOUNTER — Other Ambulatory Visit: Payer: Self-pay | Admitting: Internal Medicine

## 2012-02-24 DIAGNOSIS — M549 Dorsalgia, unspecified: Secondary | ICD-10-CM

## 2012-02-24 NOTE — Telephone Encounter (Signed)
Pt called checking on his refill for vicodin [facility] [address]

## 2012-02-24 NOTE — Telephone Encounter (Signed)
Patient called and requested a refill on vicodin.  He stated he uses it as a "preventative" medication for his pain.  Please advise.

## 2012-02-24 NOTE — Telephone Encounter (Signed)
Denied. I will not prescribe chronic "preventive" narcotics for him.

## 2012-02-25 NOTE — Telephone Encounter (Signed)
Left message notifying patient.

## 2012-03-29 ENCOUNTER — Telehealth: Payer: Self-pay | Admitting: *Deleted

## 2012-03-29 NOTE — Telephone Encounter (Signed)
Mr Moening is calling for refills on his medication. Protonix, bentyl, and vicodin.  He is [facility] using the [facility] in [place]. Thank you.

## 2012-03-30 ENCOUNTER — Emergency Department: Payer: Self-pay | Admitting: Emergency Medicine

## 2012-03-30 NOTE — Telephone Encounter (Signed)
[name] [name] called back looking to find out the status of his medications. Please call him back. Thanks.

## 2012-03-30 NOTE — Telephone Encounter (Signed)
I told Pt that we could not refill his medication because he has not been here in a long time. He is going to try to make an appointment but he lives in [place] now and it is hard for him to get up here.

## 2012-04-01 NOTE — Telephone Encounter (Signed)
Pt's has an appointment on Aug 14 @ 3:00 with [facility]

## 2012-04-20 ENCOUNTER — Encounter: Payer: Self-pay | Admitting: Internal Medicine

## 2012-04-21 ENCOUNTER — Telehealth: Payer: Self-pay | Admitting: Gastroenterology

## 2012-04-21 ENCOUNTER — Ambulatory Visit: Payer: Self-pay | Admitting: Gastroenterology

## 2012-04-21 NOTE — Telephone Encounter (Signed)
Pt was a no show

## 2012-05-03 ENCOUNTER — Emergency Department: Payer: Self-pay | Admitting: Emergency Medicine

## 2012-05-05 ENCOUNTER — Ambulatory Visit (INDEPENDENT_AMBULATORY_CARE_PROVIDER_SITE_OTHER): Payer: Self-pay | Admitting: Internal Medicine

## 2012-05-05 ENCOUNTER — Encounter: Payer: Self-pay | Admitting: Internal Medicine

## 2012-05-05 VITALS — BP 100/68 | HR 80 | Temp 98.5°F | Resp 16 | Wt 194.2 lb

## 2012-05-05 DIAGNOSIS — M545 Low back pain, unspecified: Secondary | ICD-10-CM

## 2012-05-05 DIAGNOSIS — M549 Dorsalgia, unspecified: Secondary | ICD-10-CM

## 2012-05-05 MED ORDER — PREDNISONE (PAK) 10 MG PO TABS: ORAL_TABLET | ORAL | Status: AC

## 2012-05-05 MED ORDER — METHOCARBAMOL 750 MG PO TABS: 750.0000 mg | ORAL_TABLET | Freq: Four times a day (QID) | ORAL | Status: AC

## 2012-05-05 MED ORDER — HYDROCODONE-ACETAMINOPHEN 5-500 MG PO TABS: 1.0000 | ORAL_TABLET | Freq: Every day | ORAL | Status: DC | PRN

## 2012-05-05 NOTE — Progress Notes (Signed)
Patient ID: [name] [name], male   DOB: 06-16-1971, 41 y.o.   MRN: [id number]  Patient Active Problem List  Diagnosis  . ESSENTIAL HYPERTENSION, BENIGN  . GERD  . IRRITABLE BOWEL SYNDROME  . HAND, PAIN  . NAUSEA WITH VOMITING  . EPIGASTRIC PAIN  . CLOSED FRACTURE OF SHAFT OF METACARPAL BONE  . FRACTURED TOOTH  . ABDOMINAL PAIN  . DIARRHEA  . ABDOMINAL PAIN-MULTIPLE SITES  . Hypertension  . Anxiety  . Lumbago without sciatica    Subjective:  CC:   Chief Complaint  Patient presents with  . Back Pain    HPI:   [name] Mccrone Pettigrewis a 41 y.o. male who presents  Past Medical History  Diagnosis Date  . PUD (peptic ulcer disease) 2007    Mild  . Inflammatory bowel disease   . Bipolar 1 disorder   . Hypertension   . Anxiety     Past Surgical History  Procedure Date  . Esophagogastroduodenoscopy 07/03/2006    Normal esophagus without evidence of Barrett's/ Normal duodenum/ Erythema and edema in the antrum /  1. Erythema, edema, and hemorrhage in the cardia likely secondary to   vomiting and retching  . Esophagogastroduodenoscopy 07/10/2006    Normal esophagus with patchy areas of fundal erythema and superficial erosion./ Two small prepyloric ulcers all of which had a benign appearance status post biopsy; patent pylorus         The following portions of the patient's history were reviewed and updated as appropriate: Allergies, current medications, and problem list.    Review of Systems:   12 Pt  review of systems was negative except those addressed in the HPI,     History   Social History  . Marital Status: Legally Separated    Spouse Name: N/A    Number of Children: N/A  . Years of Education: N/A   Occupational History  . Not on file.   Social History Main Topics  . Smoking status: Never Smoker   . Smokeless tobacco: Never Used  . Alcohol Use: Yes     Rare  . 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 Sexually Active: Not on file   Other  Topics Concern  . Not on file   Social History Narrative  . No narrative on file    Objective:  BP 100/68  Pulse 80  Temp 98.5 F (36.9 C) (Oral)  Resp 16  Wt 194 lb 4 oz (88.111 kg)  SpO2 98%  General appearance: alert, cooperative and appears stated age  Neck: no adenopathy, no carotid bruit, supple, symmetrical, trachea midline and thyroid not enlarged, symmetric, no tenderness/mass/nodules Back: symmetric, no curvature. ROM normal. No CVA tenderness. Lungs: clear to auscultation bilaterally Heart: regular rate and rhythm, S1, S2 normal, no murmur, click, rub or gallop Abdomen: soft, non-tender; bowel sounds normal; no masses,  no organomegaly Pulses: 2+ and symmetric Skin: Skin color, texture, turgor normal. No rashes or lesions MSK: paraspinous muscles spasm on the right,  Negative straight leg lift.  Full ROM.  Lymph nodes: Cervical, supraclavicular, and axillary nodes normal.  Assessment and Plan:  Lumbago without sciatica I explained to him that he is going to have recurrences of back pain due to strain and mild degenerative changes. He has no evidence of spinal stenosis or herniated disc on his MRI that was done within the past year. I will refill his Vicodin for this acute flare with no refills.  I have strongly  recommended that he start working with of personal trainer to strengthen his lower back.   Updated Medication List Outpatient Encounter Prescriptions as of 05/05/2012  Medication Sig Dispense Refill  . ALPRAZolam (XANAX) 1 MG tablet Take 1 mg by mouth at bedtime as needed.        Marland Kitchen amphetamine-dextroamphetamine (ADDERALL) 30 MG tablet Take 30 mg by mouth 2 (two) times daily.       . diclofenac (VOLTAREN) 75 MG EC tablet Take 1 tablet (75 mg total) by mouth 2 (two) times daily.  60 tablet  3  . dicyclomine (BENTYL) 10 MG capsule Take 10 mg by mouth 4 (four) times daily -  before meals and at bedtime.        . divalproex (DEPAKOTE) 500 MG 24 hr tablet Take 500  mg by mouth daily.        Marland Kitchen HYDROcodone-acetaminophen (VICODIN) 5-500 MG per tablet Take 1 tablet by mouth daily as needed for pain. Daily at bedtime.  May repeat if needed but maximum two daily  30 tablet  0  . pantoprazole (PROTONIX) 40 MG tablet Take 40 mg by mouth daily.        Marland Kitchen DISCONTD: HYDROcodone-acetaminophen (VICODIN) 5-500 MG per tablet Take 1 tablet by mouth daily as needed for pain. Daily at bedtime.  May repeat if needed but maximum two daily  30 tablet  0  . DISCONTD: HYDROcodone-acetaminophen (VICODIN) 5-500 MG per tablet Take 1 tablet by mouth daily as needed for pain. Daily at bedtime.  May repeat if needed but maximum two daily  60 tablet  0  . methocarbamol (ROBAXIN-750) 750 MG tablet Take 1 tablet (750 mg total) by mouth 4 (four) times daily.  60 tablet  0  . predniSONE (STERAPRED UNI-PAK) 10 MG tablet 6 tablets on Day 1 , then reduce by 1 tablet daily until gone  21 tablet  0     No orders of the defined types were placed in this encounter.    Return if symptoms worsen or fail to improve.

## 2012-05-05 NOTE — Patient Instructions (Addendum)
You need to strengthen your back muscles to prevent future strains.  I am prescribing prednisone for inflammation as a tapering dose for  6 days.   Robaxin for muscle spasm.  Vicodin for severe pain   Return for fasting labs

## 2012-05-06 ENCOUNTER — Other Ambulatory Visit: Payer: Self-pay

## 2012-05-06 NOTE — Assessment & Plan Note (Signed)
I explained to him that he is going to have recurrences of back pain due to strain and mild degenerative changes. He has no evidence of spinal stenosis or herniated disc on his MRI that was done within the past year. I will refill his Vicodin for this acute flare with no refills.  I have strongly recommended that he start working with of personal trainer to strengthen his lower back.

## 2012-05-07 ENCOUNTER — Other Ambulatory Visit: Payer: Self-pay

## 2012-05-20 ENCOUNTER — Ambulatory Visit: Payer: Self-pay | Admitting: Internal Medicine

## 2012-05-20 DIAGNOSIS — Z0289 Encounter for other administrative examinations: Secondary | ICD-10-CM

## 2012-06-28 ENCOUNTER — Encounter: Payer: Self-pay | Admitting: Internal Medicine

## 2012-06-28 ENCOUNTER — Ambulatory Visit (INDEPENDENT_AMBULATORY_CARE_PROVIDER_SITE_OTHER): Payer: Self-pay | Admitting: Internal Medicine

## 2012-06-28 VITALS — BP 140/100 | HR 86 | Temp 98.7°F | Ht 69.5 in | Wt 203.5 lb

## 2012-06-28 DIAGNOSIS — L989 Disorder of the skin and subcutaneous tissue, unspecified: Secondary | ICD-10-CM | POA: Insufficient documentation

## 2012-06-28 DIAGNOSIS — N508 Other specified disorders of male genital organs: Secondary | ICD-10-CM

## 2012-06-28 DIAGNOSIS — N5089 Other specified disorders of the male genital organs: Secondary | ICD-10-CM

## 2012-06-28 LAB — POCT URINALYSIS DIPSTICK
Bilirubin, UA: NEGATIVE
Ketones, UA: NEGATIVE
Leukocytes, UA: NEGATIVE

## 2012-06-28 NOTE — Assessment & Plan Note (Signed)
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  Pt requests vicodin today for pain management. I think this is excessive, as minimal tenderness on exam and lesion has been present x 8 months(did not mention at previous visit with PCP). Recommended that he try using existing tramadol or tylenol for pain. We will set up podiatry referral for evaluation.

## 2012-06-28 NOTE — Assessment & Plan Note (Signed)
Right testicular mass. Approx 2cm diameter, located at superior portion of right testicle. Non-tender. Urinalysis normal. Concerning for malignancy. Will set up urology evaluation this week.

## 2012-06-28 NOTE — Progress Notes (Signed)
Subjective:    Patient ID: [name] [name], male    DOB: 1971-06-16, 41 y.o.   MRN: [id number]  HPI 41 year old male presents for acute visit complaining of right-sided testicular mass. Patient reports that 2 days ago he noticed a "lump" in his right testicle. This area is described as mildly tender to palpation. The area has been persistent over the last couple of days. He denies any testicular swelling, pain with urination, hematuria, fever, chills. He denies trauma to his testicle.  He is also concerned today about an eight-month history of left plantar foot pain. He notes a nodule in the middle of the plantar surface of his left foot. This is been present for at least 8 months. He notes that his mother had a similar lesion which had to be resected. He is unsure what this was. The pain in his medial left foot radiates to his toes. He denies trauma to his foot. He has been using his wife's Vicodin with some improvement in his symptoms.  Outpatient Encounter Prescriptions as of 06/28/2012  Medication Sig Dispense Refill  . ALPRAZolam (XANAX) 1 MG tablet Take 1 mg by mouth at bedtime as needed.        Marland Kitchen amphetamine-dextroamphetamine (ADDERALL) 30 MG tablet Take 30 mg by mouth 2 (two) times daily.       . diclofenac (VOLTAREN) 75 MG EC tablet Take 1 tablet (75 mg total) by mouth 2 (two) times daily.  60 tablet  3  . dicyclomine (BENTYL) 10 MG capsule Take 10 mg by mouth 4 (four) times daily -  before meals and at bedtime.        . divalproex (DEPAKOTE) 500 MG 24 hr tablet Take 1,500 mg by mouth daily.       Marland Kitchen HYDROcodone-acetaminophen (VICODIN) 5-500 MG per tablet Take 1 tablet by mouth daily as needed for pain. Daily at bedtime.  May repeat if needed but maximum two daily  30 tablet  0  . methocarbamol (ROBAXIN) 750 MG tablet Take 750 mg by mouth 4 (four) times daily.      . pantoprazole (PROTONIX) 40 MG tablet Take 40 mg by mouth daily.         BP 140/100  Pulse 86  Temp 98.7 F (37.1 C)  (Oral)  Ht 5' 9.5" (1.765 m)  Wt 203 lb 8 oz (92.307 kg)  BMI 29.62 kg/m2  SpO2 97%  Review of Systems  Constitutional: Negative for fever, chills, activity change, appetite change, fatigue and unexpected weight change.  Eyes: Negative for visual disturbance.  Respiratory: Negative for cough and shortness of breath.   Cardiovascular: Negative for chest pain, palpitations and leg swelling.  Gastrointestinal: Negative for abdominal pain and abdominal distention.  Genitourinary: Positive for scrotal swelling and testicular pain. Negative for dysuria, urgency, frequency, hematuria, flank pain, penile swelling, difficulty urinating and penile pain.  Musculoskeletal: Positive for myalgias and arthralgias. Negative for gait problem.  Skin: Negative for color change and rash.  Hematological: Negative for adenopathy.  Psychiatric/Behavioral: Negative for disturbed wake/sleep cycle and dysphoric mood. The patient is not nervous/anxious.        Objective:   Physical Exam  Constitutional: He is oriented to person, place, and time. He appears well-developed and well-nourished. No distress.  HENT:  Head: Normocephalic and atraumatic.  Right Ear: External ear normal.  Left Ear: External ear normal.  Nose: Nose normal.  Mouth/Throat: Oropharynx is clear and moist. No oropharyngeal exudate.  Eyes: Conjunctivae normal and EOM are normal.  Pupils are equal, round, and reactive to light. Right eye exhibits no discharge. Left eye exhibits no discharge. No scleral icterus.  Neck: Normal range of motion. Neck supple. No tracheal deviation present. No thyromegaly present.  Pulmonary/Chest: Effort normal.  Genitourinary: Penis normal. Right testis shows mass (approx 2cm diameter rubbery mass at superior pole of testicle) and tenderness (mild over mass). Right testis shows no swelling. Left testis shows no mass, no swelling and no tenderness.  Musculoskeletal: Normal range of motion. He exhibits no edema.        Right foot: He exhibits deformity.       Feet:  Lymphadenopathy:    He has no cervical adenopathy.  Neurological: He is alert and oriented to person, place, and time. No cranial nerve deficit. Coordination normal.  Skin: Skin is warm and dry. No rash noted. He is not diaphoretic. No erythema. No pallor.  Psychiatric: He has a normal mood and affect. His behavior is normal. Judgment and thought content normal.          Assessment & Plan:

## 2012-07-01 ENCOUNTER — Telehealth: Payer: Self-pay | Admitting: Internal Medicine

## 2012-07-01 NOTE — Telephone Encounter (Signed)
[facility] was calling, pt was there today and wanting an rx for more vicodan for his foot pain. They were wanting to know if this was ok.

## 2012-07-02 ENCOUNTER — Telehealth: Payer: Self-pay | Admitting: Internal Medicine

## 2012-07-02 NOTE — Telephone Encounter (Signed)
Please see phone note dated 07/02/12 this was addressed in that note.

## 2012-07-02 NOTE — Telephone Encounter (Signed)
Pt called to a refill vicidaon Pt would like to pick up today [facility] on [address]  FYI Pt is going for  [employer] next week of scrotum They put pt on cipro,flomax Dr Raynald Kemp @ [facility]

## 2012-07-02 NOTE — Telephone Encounter (Signed)
Refill denied,.  As I stated at his last visit.  I will not be refilling his vicodin.

## 2012-07-02 NOTE — Telephone Encounter (Signed)
Patient called in asking if he could come by and pick up this prescription and I advised him that Dr. Darrick Huntsman wasn't going to refill his Vicodin. He said okay and hung up.

## 2012-08-23 ENCOUNTER — Emergency Department: Payer: Self-pay | Admitting: Emergency Medicine

## 2012-08-23 LAB — COMPREHENSIVE METABOLIC PANEL
Anion Gap: 7 (ref 7–16)
Bilirubin,Total: 0.5 mg/dL (ref 0.2–1.0)
Calcium, Total: 9 mg/dL (ref 8.5–10.1)
Chloride: 109 mmol/L — ABNORMAL HIGH (ref 98–107)
EGFR (African American): >60
EGFR (Non-African Amer.): >60
Glucose: 132 mg/dL — ABNORMAL HIGH (ref 65–99)
Potassium: 3.5 mmol/L (ref 3.5–5.1)
SGPT (ALT): 46 U/L (ref 12–78)

## 2012-08-23 LAB — CBC
HCT: 45.2 % (ref 40.0–52.0)
HGB: 15.4 g/dL (ref 13.0–18.0)
MCH: 29 pg (ref 26.0–34.0)
MCV: 85 fL (ref 80–100)
Platelet: 235 10*3/uL (ref 150–440)
RBC: 5.29 10*6/uL (ref 4.40–5.90)

## 2012-08-23 LAB — LIPASE, BLOOD
Lipase: 184 U/L (ref 73–393)
Lipase: 134 U/L (ref 73–393)

## 2012-08-23 LAB — MAGNESIUM: Magnesium: 1.4 mg/dL — ABNORMAL LOW

## 2012-08-26 ENCOUNTER — Emergency Department: Payer: Self-pay | Admitting: Emergency Medicine

## 2012-09-26 ENCOUNTER — Emergency Department: Payer: Self-pay | Admitting: Internal Medicine

## 2012-09-26 LAB — URIC ACID: Uric Acid: 6.7 mg/dL (ref 3.5–7.2)

## 2012-10-05 ENCOUNTER — Encounter: Payer: Self-pay | Admitting: Internal Medicine

## 2012-10-05 ENCOUNTER — Ambulatory Visit (INDEPENDENT_AMBULATORY_CARE_PROVIDER_SITE_OTHER): Payer: Self-pay | Admitting: Internal Medicine

## 2012-10-05 VITALS — BP 120/80 | HR 81 | Temp 98.7°F | Wt 207.0 lb

## 2012-10-05 DIAGNOSIS — M79609 Pain in unspecified limb: Secondary | ICD-10-CM

## 2012-10-05 DIAGNOSIS — M549 Dorsalgia, unspecified: Secondary | ICD-10-CM

## 2012-10-05 DIAGNOSIS — M79671 Pain in right foot: Secondary | ICD-10-CM

## 2012-10-05 MED ORDER — HYDROCODONE-ACETAMINOPHEN 5-325 MG PO TABS: 1.0000 | ORAL_TABLET | Freq: Four times a day (QID) | ORAL | Status: DC | PRN

## 2012-10-05 MED ORDER — PREDNISONE (PAK) 10 MG PO TABS: ORAL_TABLET | ORAL | Status: DC

## 2012-10-05 MED ORDER — HYDROCODONE-ACETAMINOPHEN 5-500 MG PO TABS: 1.0000 | ORAL_TABLET | Freq: Every day | ORAL | Status: DC | PRN

## 2012-10-05 NOTE — Progress Notes (Signed)
Patient ID: [name] [name], male   DOB: [birth year], 42 y.o.   MRN: [id number]   Patient Active Problem List  Diagnosis  . GERD  . IRRITABLE BOWEL SYNDROME  . Anxiety  . Lumbago without sciatica  . Foot lesion  . Testicular mass  . Foot pain, right    Subjective:  CC:   Chief Complaint  Patient presents with  . ER follow-up for foot pain    HPI:   [name] [name] a 42 y.o. male who presents  Past Medical History  Diagnosis Date  . PUD (peptic ulcer disease) 2007    Mild  . Inflammatory bowel disease   . Bipolar 1 disorder   . Hypertension   . Anxiety     Past Surgical History  Procedure Date  . Esophagogastroduodenoscopy 07/03/2006    Normal esophagus without evidence of Barrett's/ Normal duodenum/ Erythema and edema in the antrum /  1. Erythema, edema, and hemorrhage in the cardia likely secondary to   vomiting and retching  . Esophagogastroduodenoscopy 07/10/2006    Normal esophagus with patchy areas of fundal erythema and superficial erosion./ Two small prepyloric ulcers all of which had a benign appearance status post biopsy; patent pylorus         The following portions of the patient's history were reviewed and updated as appropriate: Allergies, current medications, and problem list.    Review of Systems:   12 Pt  review of systems was negative except those addressed in the HPI,     History   Social History  . Marital Status: Legally Separated    Spouse Name: N/A    Number of Children: N/A  . Years of Education: N/A   Occupational History  . Not on file.   Social History Main Topics  . Smoking status: Never Smoker   . Smokeless tobacco: Never Used  . Alcohol Use: Yes     Comment: Rare  . 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 Sexually Active: Not on file   Other Topics Concern  . Not on file   Social History Narrative  . No narrative on file    Objective:  BP 120/80  Pulse 81  Temp 98.7 F (37.1 C)  (Tympanic)  Wt 207 lb (93.895 kg)  SpO2 99%  General appearance: alert, cooperative and appears stated age Ears: normal TM's and external ear canals both ears Throat: lips, mucosa, and tongue normal; teeth and gums normal Neck: no adenopathy, no carotid bruit, supple, symmetrical, trachea midline and thyroid not enlarged, symmetric, no tenderness/mass/nodules Back: symmetric, no curvature. ROM normal. No CVA tenderness. Lungs: clear to auscultation bilaterally Heart: regular rate and rhythm, S1, S2 normal, no murmur, click, rub or gallop Abdomen: soft, non-tender; bowel sounds normal; no masses,  no organomegaly Pulses: 2+ and symmetric Skin: Skin color, texture, turgor normal. No rashes or lesions Lymph nodes: Cervical, supraclavicular, and axillary nodes normal. MSK: right foot swollen, blue discoloration noted  Assessment and Plan:  Foot pain, right I suspect his pain is due to gout based on history and workup thus far.  He has tenderness to palpation and ecchymosis of the toe.   Prednisone taper , vicodin for pain,  podiatry referral to [facility]    Updated Medication List Outpatient Encounter Prescriptions as of 10/05/2012  Medication Sig Dispense Refill  . ALPRAZolam (XANAX) 1 MG tablet Take 1 mg by mouth at bedtime as needed.        Marland Kitchen  amphetamine-dextroamphetamine (ADDERALL) 30 MG tablet Take 30 mg by mouth 2 (two) times daily.       Marland Kitchen dicyclomine (BENTYL) 10 MG capsule Take 10 mg by mouth 4 (four) times daily -  before meals and at bedtime.        . divalproex (DEPAKOTE) 500 MG 24 hr tablet Take 1,500 mg by mouth daily.       . methocarbamol (ROBAXIN) 750 MG tablet Take 750 mg by mouth 4 (four) times daily.      . pantoprazole (PROTONIX) 40 MG tablet Take 40 mg by mouth daily.        . [DISCONTINUED] HYDROcodone-acetaminophen (VICODIN) 5-500 MG per tablet Take 1 tablet by mouth daily as needed for pain. Daily at bedtime.  May repeat if needed but maximum two daily  30 tablet  0  .  [DISCONTINUED] HYDROcodone-acetaminophen (VICODIN) 5-500 MG per tablet Take 1 tablet by mouth daily as needed for pain. Daily at bedtime.  May repeat if needed but maximum two daily  30 tablet  0  . [DISCONTINUED] oxyCODONE-acetaminophen (PERCOCET/ROXICET) 5-325 MG per tablet Take 1 tablet by mouth every 6 (six) hours as needed.      Marland Kitchen HYDROcodone-acetaminophen (NORCO/VICODIN) 5-325 MG per tablet Take 1 tablet by mouth every 6 (six) hours as needed for pain.  30 tablet  0  . predniSONE (STERAPRED UNI-PAK) 10 MG tablet 6 tablets on Day 1 , then reduce by 1 tablet daily until gone  21 tablet  0  . [DISCONTINUED] HYDROcodone-acetaminophen (NORCO/VICODIN) 5-325 MG per tablet Take 1 tablet by mouth every 6 (six) hours as needed for pain.  60 tablet  3     Orders Placed This Encounter  Procedures  . Sedimentation rate  . C-reactive protein  . ANA  . Rheumatoid factor  . Ambulatory referral to Podiatry    No Follow-up on file.

## 2012-10-05 NOTE — Patient Instructions (Addendum)
Stop the otc anti inflammatories   continue the protonix for your stomach   Start the prednisone taper  (this will treat gout,  And other forms of arthritis)   Referral to [facility] podiatry underway .   We are checking labs today to look for auto immune types of arthritis

## 2012-10-05 NOTE — Assessment & Plan Note (Signed)
I suspect his pain is due to gout based on history and workup thus far.  He has tenderness to palpation and ecchymosis of the toe.   Prednisone taper , vicodin for pain,  podiatry referral to [facility]

## 2012-10-06 LAB — RHEUMATOID FACTOR: Rhuematoid fact SerPl-aCnc: <10 IU/mL (ref <=14)

## 2012-10-06 LAB — ANA: Anti Nuclear Antibody(ANA): NEGATIVE

## 2012-10-06 LAB — C-REACTIVE PROTEIN: CRP: 0.8 mg/dL (ref 0.5–20.0)

## 2012-10-06 LAB — SEDIMENTATION RATE: Sed Rate: 12 mm/hr (ref 0–22)

## 2012-10-07 ENCOUNTER — Telehealth: Payer: Self-pay | Admitting: Internal Medicine

## 2012-10-07 ENCOUNTER — Ambulatory Visit: Payer: Self-pay | Admitting: Internal Medicine

## 2012-10-07 NOTE — Telephone Encounter (Signed)
Pt returned your call.  

## 2012-10-11 ENCOUNTER — Telehealth: Payer: Self-pay | Admitting: Internal Medicine

## 2012-10-11 NOTE — Telephone Encounter (Signed)
Pt.notified

## 2012-10-11 NOTE — Telephone Encounter (Signed)
Patient's pain medication is not going to last him until he can be seen on February 25 @ 2:15 with the podiatrist at [facility]. He is requesting more.

## 2012-10-11 NOTE — Telephone Encounter (Signed)
There will be no refills,  His labs were normal and his x  Rays were normal.

## 2012-10-15 ENCOUNTER — Other Ambulatory Visit: Payer: Self-pay | Admitting: General Practice
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
## 2012-10-15 NOTE — Telephone Encounter (Signed)
Called pt for clarification on voicemail left yesterday. Could not reach pt.

## 2013-04-10 ENCOUNTER — Emergency Department: Payer: Self-pay | Admitting: Emergency Medicine

## 2013-04-28 HISTORY — PX: OPEN REDUCTION INTERNAL FIXATION (ORIF) METACARPAL: SHX6234

## 2013-05-26 HISTORY — PX: INCISION AND DRAINAGE ABSCESS: SHX5864

## 2013-05-26 HISTORY — PX: HAND HARDWARE REMOVAL: SUR1125

## 2013-10-18 IMAGING — CR DG TOE 2ND*L*
1 series · 4 of 4 positions shown · non-contrast
Comparison: none

REASON FOR EXAM: pain
COMMENTS:

PROCEDURE:     DXR - DXR TOE 2ND DIGIT LEFT FOOT  - March 30, 2012  [DATE]
RESULT:     Comparison:  None

[Series 1: x toes ap left · 0.14mm/px · 4 of 4 slices shown]
[im 1/4]
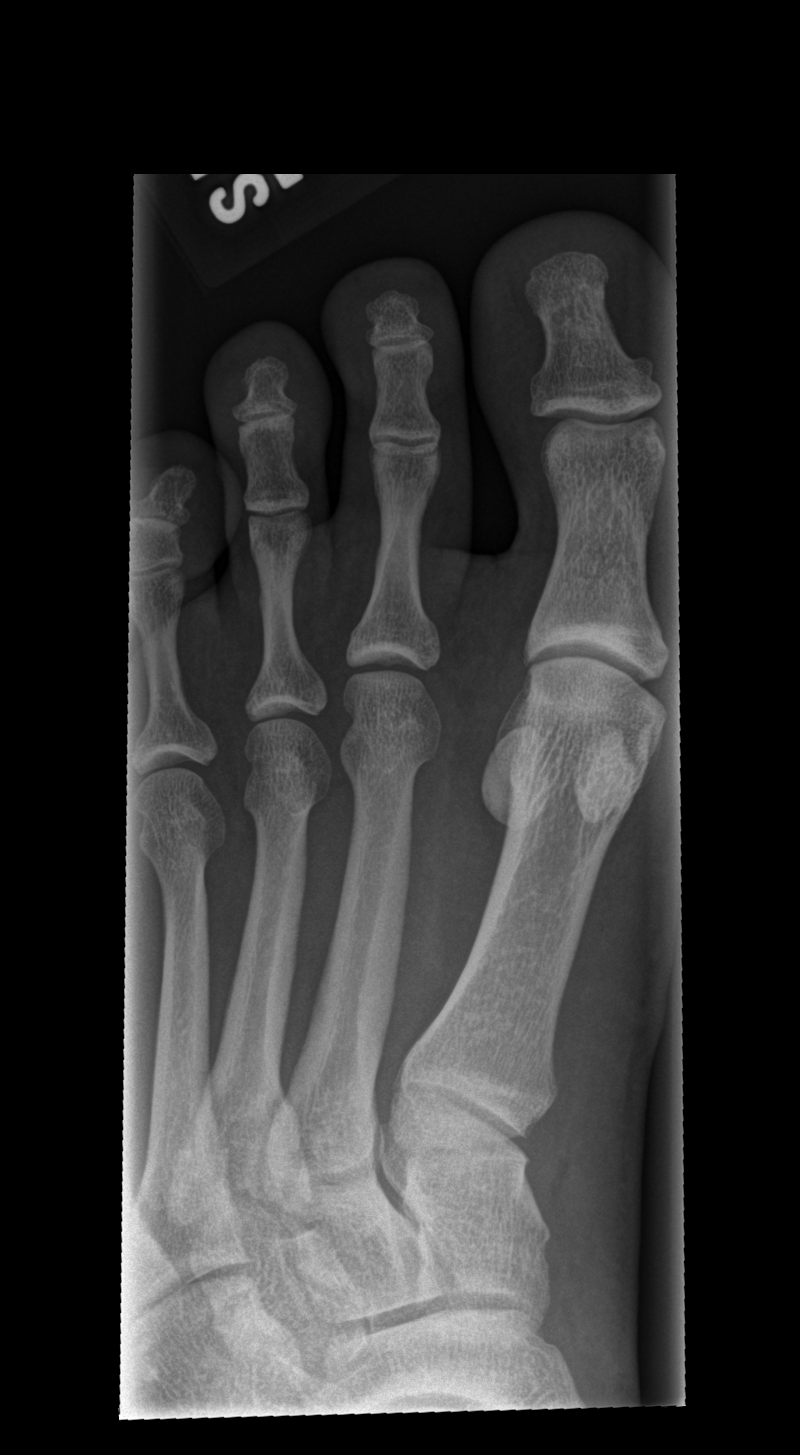
[im 2/4]
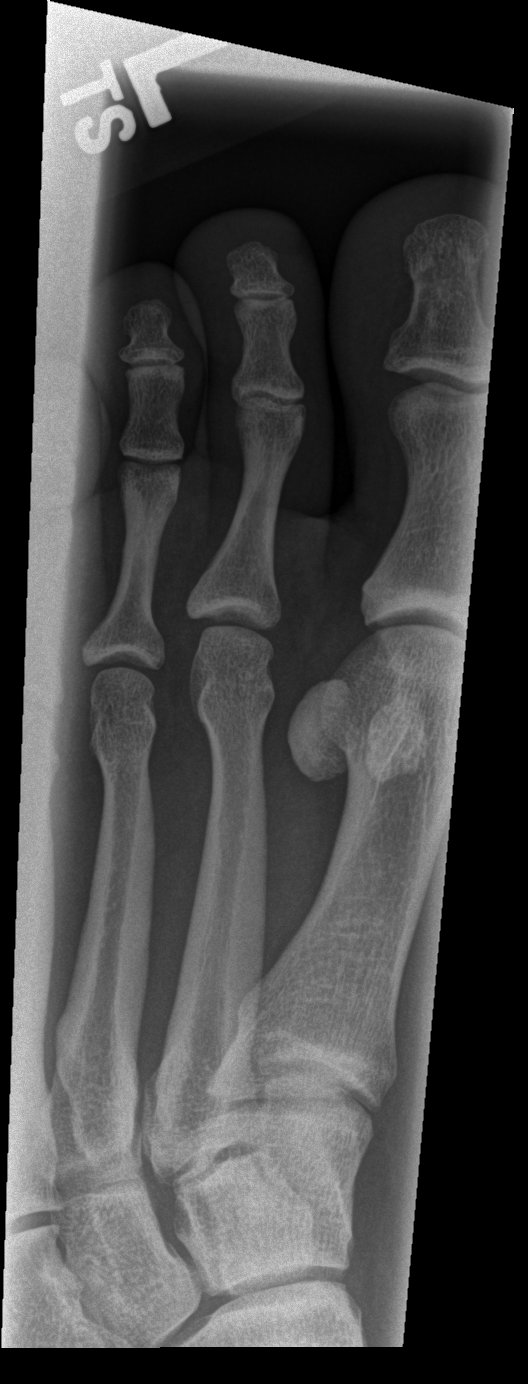
[im 3/4]
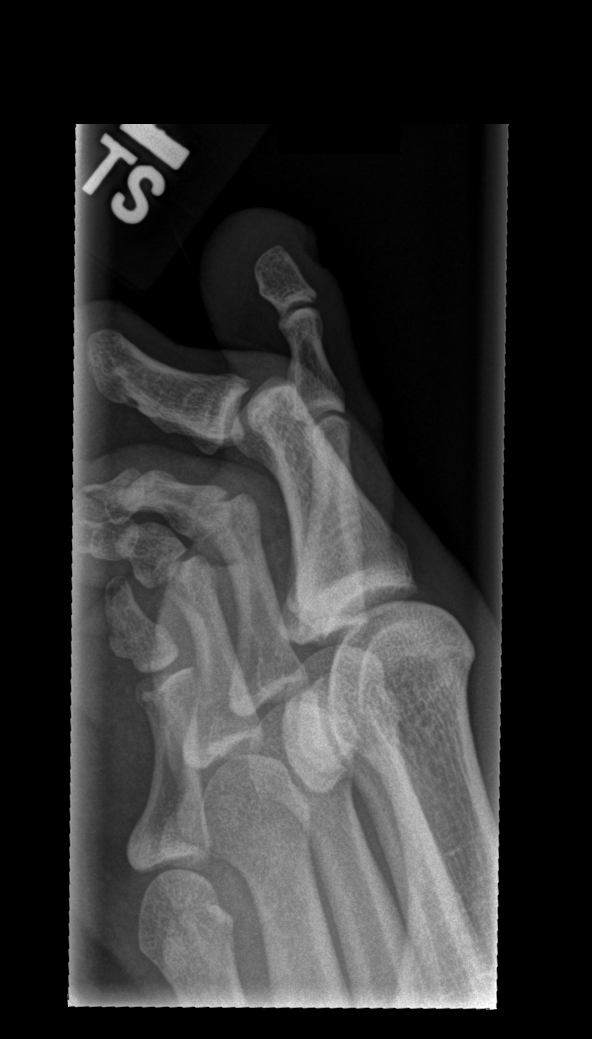
[im 4/4]
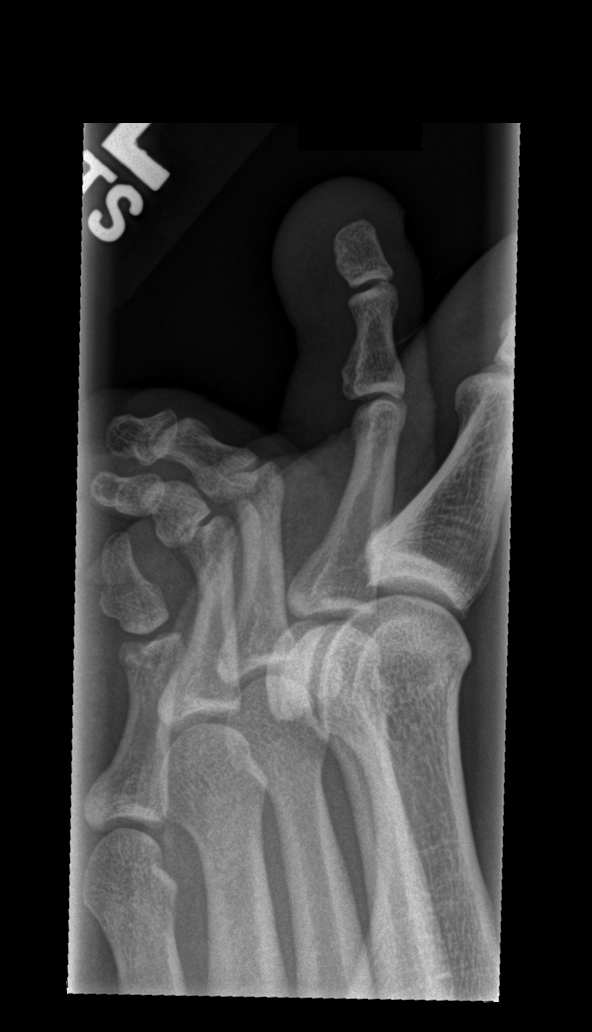

[4 of 4 positions shown; findings below may reference images not displayed]

FINDINGS: Three coned-down views of the left second toe demonstrates no fracture or
dislocation. The soft tissues are normal.
IMPRESSION: No acute osseous injury of the left second toe.

[REDACTED]

## 2014-05-25 ENCOUNTER — Emergency Department: Payer: Self-pay | Admitting: Emergency Medicine

## 2017-01-02 ENCOUNTER — Other Ambulatory Visit: Payer: Self-pay | Admitting: Orthopedic Surgery

## 2017-01-13 ENCOUNTER — Encounter (HOSPITAL_BASED_OUTPATIENT_CLINIC_OR_DEPARTMENT_OTHER): Payer: Self-pay | Admitting: *Deleted

## 2017-01-14 ENCOUNTER — Encounter (HOSPITAL_BASED_OUTPATIENT_CLINIC_OR_DEPARTMENT_OTHER)
Admission: RE | Admit: 2017-01-14 | Discharge: 2017-01-14 | Disposition: A | Discharge status: Home / Self Care | Payer: Worker's Compensation | Source: Ambulatory Visit | Attending: Orthopedic Surgery | Admitting: Orthopedic Surgery

## 2017-01-14 DIAGNOSIS — F419 Anxiety disorder, unspecified: Secondary | ICD-10-CM | POA: Diagnosis not present

## 2017-01-14 DIAGNOSIS — X500XXA Overexertion from strenuous movement or load, initial encounter: Secondary | ICD-10-CM | POA: Diagnosis not present

## 2017-01-14 DIAGNOSIS — K219 Gastro-esophageal reflux disease without esophagitis: Secondary | ICD-10-CM | POA: Diagnosis not present

## 2017-01-14 DIAGNOSIS — Z6833 Body mass index (BMI) 33.0-33.9, adult: Secondary | ICD-10-CM | POA: Diagnosis not present

## 2017-01-14 DIAGNOSIS — I10 Essential (primary) hypertension: Secondary | ICD-10-CM | POA: Insufficient documentation

## 2017-01-14 DIAGNOSIS — Y999 Unspecified external cause status: Secondary | ICD-10-CM | POA: Diagnosis not present

## 2017-01-14 DIAGNOSIS — S63511A Sprain of carpal joint of right wrist, initial encounter: Secondary | ICD-10-CM | POA: Diagnosis not present

## 2017-01-14 DIAGNOSIS — F319 Bipolar disorder, unspecified: Secondary | ICD-10-CM | POA: Diagnosis not present

## 2017-01-14 DIAGNOSIS — X509XXA Other and unspecified overexertion or strenuous movements or postures, initial encounter: Secondary | ICD-10-CM | POA: Diagnosis not present

## 2017-01-14 DIAGNOSIS — Y929 Unspecified place or not applicable: Secondary | ICD-10-CM | POA: Diagnosis not present

## 2017-01-15 ENCOUNTER — Ambulatory Visit (HOSPITAL_BASED_OUTPATIENT_CLINIC_OR_DEPARTMENT_OTHER)
Admission: RE | Admit: 2017-01-15 | Discharge: 2017-01-15 | Disposition: A | Discharge status: Home / Self Care | Payer: Worker's Compensation | Source: Ambulatory Visit | Attending: Orthopedic Surgery | Admitting: Orthopedic Surgery

## 2017-01-15 ENCOUNTER — Encounter (HOSPITAL_BASED_OUTPATIENT_CLINIC_OR_DEPARTMENT_OTHER)
Admission: RE | Disposition: A | Discharge status: Home / Self Care | Payer: Self-pay | Source: Ambulatory Visit | Attending: Orthopedic Surgery

## 2017-01-15 ENCOUNTER — Ambulatory Visit (HOSPITAL_BASED_OUTPATIENT_CLINIC_OR_DEPARTMENT_OTHER): Payer: Worker's Compensation | Admitting: Anesthesiology

## 2017-01-15 ENCOUNTER — Encounter (HOSPITAL_BASED_OUTPATIENT_CLINIC_OR_DEPARTMENT_OTHER): Payer: Self-pay | Admitting: Anesthesiology

## 2017-01-15 DIAGNOSIS — Y929 Unspecified place or not applicable: Secondary | ICD-10-CM | POA: Insufficient documentation

## 2017-01-15 DIAGNOSIS — X500XXA Overexertion from strenuous movement or load, initial encounter: Secondary | ICD-10-CM | POA: Insufficient documentation

## 2017-01-15 DIAGNOSIS — Z6833 Body mass index (BMI) 33.0-33.9, adult: Secondary | ICD-10-CM | POA: Insufficient documentation

## 2017-01-15 DIAGNOSIS — S63511A Sprain of carpal joint of right wrist, initial encounter: Secondary | ICD-10-CM | POA: Insufficient documentation

## 2017-01-15 DIAGNOSIS — F319 Bipolar disorder, unspecified: Secondary | ICD-10-CM | POA: Insufficient documentation

## 2017-01-15 DIAGNOSIS — I10 Essential (primary) hypertension: Secondary | ICD-10-CM | POA: Diagnosis not present

## 2017-01-15 DIAGNOSIS — X509XXA Other and unspecified overexertion or strenuous movements or postures, initial encounter: Secondary | ICD-10-CM | POA: Insufficient documentation

## 2017-01-15 DIAGNOSIS — F419 Anxiety disorder, unspecified: Secondary | ICD-10-CM | POA: Diagnosis not present

## 2017-01-15 DIAGNOSIS — Y999 Unspecified external cause status: Secondary | ICD-10-CM | POA: Insufficient documentation

## 2017-01-15 DIAGNOSIS — K219 Gastro-esophageal reflux disease without esophagitis: Secondary | ICD-10-CM | POA: Insufficient documentation

## 2017-01-15 HISTORY — DX: Hyperlipidemia, unspecified: E78.5

## 2017-01-15 HISTORY — DX: Gastro-esophageal reflux disease without esophagitis: K21.9

## 2017-01-15 HISTORY — PX: WRIST ARTHROSCOPY WITH DEBRIDEMENT: SHX6194

## 2017-01-15 SURGERY — WRIST ARTHROSCOPY WITH DEBRIDEMENT
Anesthesia: Regional | Site: Wrist | Laterality: Right

## 2017-01-15 MED ORDER — HYDROCODONE-ACETAMINOPHEN 5-325 MG PO TABS: 1.0000 | ORAL_TABLET | Freq: Four times a day (QID) | ORAL | 0 refills | Status: DC | PRN

## 2017-01-15 MED ORDER — FENTANYL CITRATE (PF) 100 MCG/2ML IJ SOLN
50.0000 ug | INTRAMUSCULAR | Status: DC | PRN
  Administered 2017-01-15: 100 ug via INTRAVENOUS

## 2017-01-15 MED ORDER — VANCOMYCIN HCL IN DEXTROSE 1-5 GM/200ML-% IV SOLN
1000.0000 mg | INTRAVENOUS | Status: AC
  Administered 2017-01-15: 1000 mg via INTRAVENOUS

## 2017-01-15 MED ORDER — SCOPOLAMINE 1 MG/3DAYS TD PT72: 1.0000 | MEDICATED_PATCH | Freq: Once | TRANSDERMAL | Status: DC | PRN

## 2017-01-15 MED ORDER — CHLORHEXIDINE GLUCONATE 4 % EX LIQD: 60.0000 mL | Freq: Once | CUTANEOUS | Status: DC

## 2017-01-15 MED ORDER — DEXAMETHASONE SODIUM PHOSPHATE 10 MG/ML IJ SOLN
INTRAMUSCULAR | Status: DC | PRN
  Administered 2017-01-15: 10 mg via INTRAVENOUS

## 2017-01-15 MED ORDER — ONDANSETRON HCL 4 MG/2ML IJ SOLN
INTRAMUSCULAR | Status: DC | PRN
  Administered 2017-01-15: 4 mg via INTRAVENOUS

## 2017-01-15 MED ORDER — LIDOCAINE-EPINEPHRINE (PF) 1.5 %-1:200000 IJ SOLN
INTRAMUSCULAR | Status: DC | PRN
  Administered 2017-01-15: 10 mL via PERINEURAL

## 2017-01-15 MED ORDER — MIDAZOLAM HCL 2 MG/2ML IJ SOLN
INTRAMUSCULAR | Status: AC
  Filled 2017-01-15: qty 2

## 2017-01-15 MED ORDER — FENTANYL CITRATE (PF) 100 MCG/2ML IJ SOLN
INTRAMUSCULAR | Status: AC
  Filled 2017-01-15: qty 2

## 2017-01-15 MED ORDER — PROPOFOL 500 MG/50ML IV EMUL
INTRAVENOUS | Status: AC
  Filled 2017-01-15: qty 50

## 2017-01-15 MED ORDER — LACTATED RINGERS IV SOLN
INTRAVENOUS | Status: DC
  Administered 2017-01-15 (×2): via INTRAVENOUS

## 2017-01-15 MED ORDER — MIDAZOLAM HCL 2 MG/2ML IJ SOLN
1.0000 mg | INTRAMUSCULAR | Status: DC | PRN
  Administered 2017-01-15: 2 mg via INTRAVENOUS

## 2017-01-15 MED ORDER — PROPOFOL 10 MG/ML IV BOLUS
INTRAVENOUS | Status: DC | PRN
  Administered 2017-01-15: 20 mg via INTRAVENOUS
  Administered 2017-01-15: 120 mg via INTRAVENOUS

## 2017-01-15 MED ORDER — ROPIVACAINE HCL 5 MG/ML IJ SOLN
INTRAMUSCULAR | Status: DC | PRN
  Administered 2017-01-15: 30 mL via PERINEURAL

## 2017-01-15 MED ORDER — PROPOFOL 500 MG/50ML IV EMUL
INTRAVENOUS | Status: DC | PRN
  Administered 2017-01-15: 100 ug/kg/min via INTRAVENOUS

## 2017-01-15 MED ORDER — VANCOMYCIN HCL IN DEXTROSE 1-5 GM/200ML-% IV SOLN
INTRAVENOUS | Status: AC
  Filled 2017-01-15: qty 200

## 2017-01-15 SURGICAL SUPPLY — 81 items
BLADE CUDA 2.0 (BLADE) IMPLANT
BLADE EAR TYMPAN 2.5 60D BEAV (BLADE) IMPLANT
BLADE MINI RND TIP GREEN BEAV (BLADE) IMPLANT
BLADE SURG 15 STRL LF DISP TIS (BLADE) ×1 IMPLANT
BLADE SURG 15 STRL SS (BLADE) ×2
BNDG COHESIVE 3X5 TAN STRL LF (GAUZE/BANDAGES/DRESSINGS) ×3 IMPLANT
BNDG ESMARK 4X9 LF (GAUZE/BANDAGES/DRESSINGS) IMPLANT
BNDG GAUZE ELAST 4 BULKY (GAUZE/BANDAGES/DRESSINGS) ×3 IMPLANT
BUR CUDA 2.9 (BURR) IMPLANT
BUR CUDA 2.9MM (BURR)
BUR FULL RADIUS 2.0 (BURR) IMPLANT
BUR FULL RADIUS 2.0MM (BURR)
BUR FULL RADIUS 2.9 (BURR) IMPLANT
BUR FULL RADIUS 2.9MM (BURR)
BUR GATOR 2.9 (BURR) IMPLANT
BUR GATOR 2.9MM (BURR)
BUR SPHERICAL 2.9 (BURR) IMPLANT
BUR SPHERICAL 2.9MM (BURR)
CANISTER SUCT 1200ML W/VALVE (MISCELLANEOUS) IMPLANT
CHLORAPREP W/TINT 26ML (MISCELLANEOUS) ×3 IMPLANT
CORDS BIPOLAR (ELECTRODE) IMPLANT
COVER BACK TABLE 60X90IN (DRAPES) ×3 IMPLANT
COVER MAYO STAND STRL (DRAPES) ×3 IMPLANT
CUFF TOURNIQUET SINGLE 18IN (TOURNIQUET CUFF) ×3 IMPLANT
DRAPE EXTREMITY T 121X128X90 (DRAPE) ×3 IMPLANT
DRAPE IMP U-DRAPE 54X76 (DRAPES) ×3 IMPLANT
DRAPE OEC MINIVIEW 54X84 (DRAPES) IMPLANT
DRAPE SURG 17X23 STRL (DRAPES) ×3 IMPLANT
ELECT SMALL JOINT 90D BASC (ELECTRODE) IMPLANT
GAUZE SPONGE 4X4 12PLY STRL (GAUZE/BANDAGES/DRESSINGS) ×3 IMPLANT
GAUZE XEROFORM 1X8 LF (GAUZE/BANDAGES/DRESSINGS) ×3 IMPLANT
GLOVE BIOGEL PI IND STRL 7.0 (GLOVE) ×2 IMPLANT
GLOVE BIOGEL PI IND STRL 8 (GLOVE) ×1 IMPLANT
GLOVE BIOGEL PI IND STRL 8.5 (GLOVE) ×1 IMPLANT
GLOVE BIOGEL PI INDICATOR 7.0 (GLOVE) ×4
GLOVE BIOGEL PI INDICATOR 8 (GLOVE) ×2
GLOVE BIOGEL PI INDICATOR 8.5 (GLOVE) ×2
GLOVE SURG ORTHO 8.0 STRL STRW (GLOVE) ×3 IMPLANT
GOWN STRL REUS W/ TWL LRG LVL3 (GOWN DISPOSABLE) ×1 IMPLANT
GOWN STRL REUS W/TWL LRG LVL3 (GOWN DISPOSABLE) ×2
GOWN STRL REUS W/TWL XL LVL3 (GOWN DISPOSABLE) ×3 IMPLANT
IV NS IRRIG 3000ML ARTHROMATIC (IV SOLUTION) ×3 IMPLANT
IV SET EXT 30 76VOL 4 MALE LL (IV SETS) ×3 IMPLANT
MANIFOLD NEPTUNE II (INSTRUMENTS) IMPLANT
NDL SAFETY ECLIPSE 18X1.5 (NEEDLE) ×1 IMPLANT
NEEDLE HYPO 18GX1.5 SHARP (NEEDLE) ×2
NEEDLE HYPO 22GX1.5 SAFETY (NEEDLE) ×3 IMPLANT
NEEDLE SPNL 18GX3.5 QUINCKE PK (NEEDLE) IMPLANT
NEEDLE TUOHY 20GX3.5 (NEEDLE) IMPLANT
NS IRRIG 1000ML POUR BTL (IV SOLUTION) IMPLANT
PACK BASIN DAY SURGERY FS (CUSTOM PROCEDURE TRAY) ×3 IMPLANT
PAD CAST 3X4 CTTN HI CHSV (CAST SUPPLIES) ×1 IMPLANT
PADDING CAST ABS 3INX4YD NS (CAST SUPPLIES) ×2
PADDING CAST ABS 4INX4YD NS (CAST SUPPLIES) ×2
PADDING CAST ABS COTTON 3X4 (CAST SUPPLIES) ×1 IMPLANT
PADDING CAST ABS COTTON 4X4 ST (CAST SUPPLIES) ×1 IMPLANT
PADDING CAST COTTON 3X4 STRL (CAST SUPPLIES) ×2
PROBE BIPOLAR ARTHRO 85MM 30D (MISCELLANEOUS) IMPLANT
ROUTER HOODED VORTEX 2.9MM (BLADE) IMPLANT
SET ARTHROSCOPY TUBING (MISCELLANEOUS) ×2
SET ARTHROSCOPY TUBING LN (MISCELLANEOUS) ×1 IMPLANT
SET SM JOINT TUBING/CANN (CANNULA) IMPLANT
SLEEVE SCD COMPRESS KNEE MED (MISCELLANEOUS) ×3 IMPLANT
SPLINT PLASTER CAST XFAST 3X15 (CAST SUPPLIES) IMPLANT
SPLINT PLASTER XTRA FASTSET 3X (CAST SUPPLIES)
STOCKINETTE 4X48 STRL (DRAPES) ×3 IMPLANT
SUCTION FRAZIER HANDLE 10FR (MISCELLANEOUS)
SUCTION TUBE FRAZIER 10FR DISP (MISCELLANEOUS) IMPLANT
SUT ETHILON 4 0 PS 2 18 (SUTURE) ×3 IMPLANT
SUT MERSILENE 4 0 P 3 (SUTURE) IMPLANT
SUT PDS AB 2-0 CT2 27 (SUTURE) IMPLANT
SUT STEEL 4 0 (SUTURE) IMPLANT
SUT VIC AB 2-0 PS2 27 (SUTURE) IMPLANT
SUT VICRYL 4-0 PS2 18IN ABS (SUTURE) IMPLANT
SYR BULB 3OZ (MISCELLANEOUS) ×3 IMPLANT
SYR CONTROL 10ML LL (SYRINGE) ×3 IMPLANT
TOWEL OR NON WOVEN STRL DISP B (DISPOSABLE) ×3 IMPLANT
TUBE CONNECTING 20'X1/4 (TUBING) ×1
TUBE CONNECTING 20X1/4 (TUBING) ×2 IMPLANT
UNDERPAD 30X30 (UNDERPADS AND DIAPERS) ×3 IMPLANT
WATER STERILE IRR 1000ML POUR (IV SOLUTION) ×3 IMPLANT

## 2017-01-15 NOTE — Brief Op Note (Signed)
01/15/2017  1:26 PM  PATIENT:  [name] [name]  46 y.o. male  PRE-OPERATIVE DIAGNOSIS:  LUNOTRIQUETAL TEAR RIGHT WRIST  POST-OPERATIVE DIAGNOSIS:  LUNOTRIQUETAL TEAR RIGHT WRIST  PROCEDURE:  Procedure(s) with comments: RIGHT WRIST ARTHROSCOPY WITH DEBRIDEMENT AND SHRINKAGE LUNOTRIQUETRAL TEAR (Right) - AXILLARY BLOCK  SURGEON:  Surgeon(s) and Role:    * Cindee SaltKuzma, Tobie Hellen, MD - Primary  PHYSICIAN ASSISTANT:   ASSISTANTS: none   ANESTHESIA:   regional and general  EBL:  Total I/O In: 1000 [I.V.:1000] Out: 25 [Blood:25]  BLOOD ADMINISTERED:none  DRAINS: none   LOCAL MEDICATIONS USED:  NONE  SPECIMEN:  No Specimen  DISPOSITION OF SPECIMEN:  N/A  COUNTS:  YES  TOURNIQUET:   Total Tourniquet Time Documented: Upper Arm (Right) - 19 minutes Total: Upper Arm (Right) - 19 minutes   DICTATION: .Other Dictation: Dictation Number [id number]  PLAN OF CARE: Discharge to home after PACU  PATIENT DISPOSITION:  PACU - hemodynamically stable.

## 2017-01-15 NOTE — Anesthesia Postprocedure Evaluation (Signed)
Anesthesia Post Note  Patient: [name] [name]  Procedure(s) Performed: Procedure(s) (LRB): RIGHT WRIST ARTHROSCOPY WITH DEBRIDEMENT AND SHRINKAGE LUNOTRIQUETRAL TEAR (Right)  Patient location during evaluation: PACU Anesthesia Type: General Level of consciousness: awake and alert Pain management: pain level controlled Vital Signs Assessment: post-procedure vital signs reviewed and stable Respiratory status: spontaneous breathing, nonlabored ventilation, respiratory function stable and patient connected to nasal cannula oxygen Cardiovascular status: blood pressure returned to baseline and stable Postop Assessment: no signs of nausea or vomiting Anesthetic complications: no       Last Vitals:  Vitals:   01/15/17 1400 01/15/17 1423  BP: (!) 119/93 123/87  Pulse: 82 90  Resp: 19 18  Temp:  36.5 C    Last Pain:  Vitals:   01/15/17 1423  TempSrc: Oral  PainSc: 0-No pain                 Kennieth RadFitzgerald, Lauralee Waters E

## 2017-01-15 NOTE — H&P (Signed)
[name] [name] is an 46 y.o. male.   Chief Complaint: ulnar wrist pain  HPI: [name] [name] is a 46 year old right-hand-dominant male who sustained an injury to his right wrist when he was lifting a 50 pound box this twisted his wrist when it gave way. He complained of pain on the ulnar aspect. He continued to work his wrist began swelling up he was seen to have given Aleve and ice to use he was subsequently referred to the coronal clinic where x-rays were taken and reported negative he was placed in a brace and Voltaren gel he continues to complain pain on the ulnar aspect of his wrist he has a mild throbbing aching pain with a VAS score of 7/10. He complains of pain especially with supination pronation. Localizes over the distal aspect of the ulna. He has a history of a boxer's fracture treated by Dr. Ardine Eng at [facility] with pins. He developed a MRSA infection following that. He has no radiation of his pain. He has been working well with a splint on. He has no history diabetes thyroid problems arthritis but does have a history of gout. Family history is positive diabetes negative for thyroid problems arthritis and gout. His been tested for diabetes. He has IBS.He has had splinting without relief of symptoms. He was sent for MR arthrogram. This is been done and read out by Dr. Jodell Cipro.          Past Medical History:  Diagnosis Date  . Anxiety   . Bipolar 1 disorder (HCC)   . GERD (gastroesophageal reflux disease)   . Hyperlipidemia   . Hypertension   . Inflammatory bowel disease   . PUD (peptic ulcer disease) 2007   Mild    Past Surgical History:  Procedure Laterality Date  . ESOPHAGOGASTRODUODENOSCOPY  07/03/2006   Normal esophagus without evidence of Barrett's/ Normal duodenum/ Erythema and edema in the antrum /  1. Erythema, edema, and hemorrhage in the cardia likely secondary to   vomiting and retching  . ESOPHAGOGASTRODUODENOSCOPY  07/10/2006   Normal esophagus with patchy areas of  fundal erythema and superficial erosion./ Two small prepyloric ulcers all of which had a benign appearance status post biopsy; patent pylorus  . HAND SURGERY Right    x2    Family History  Problem Relation Age of Onset  . Diabetes Mother   . Heart disease Father   . Diabetes Maternal Grandfather    Social History:  reports that he has never smoked. He has never used smokeless tobacco. He reports that he does not drink alcohol or use drugs.  Allergies:  Allergies  Allergen Reactions  . Morphine Anaphylaxis    REACTION: tolerates hydrocodone  . Ibuprofen     gastritis  . Penicillins     Medications Prior to Admission  Medication Sig Dispense Refill  . alprazolam (XANAX) 2 MG tablet Take 2 mg by mouth at bedtime as needed for sleep.    Marland Kitchen amlodipine-atorvastatin (CADUET) 10-10 MG tablet Take 1 tablet by mouth daily.    Marland Kitchen amphetamine-dextroamphetamine (ADDERALL) 30 MG tablet Take 30 mg by mouth 2 (two) times daily.     Marland Kitchen dicyclomine (BENTYL) 10 MG capsule Take 10 mg by mouth 4 (four) times daily -  before meals and at bedtime.      . divalproex (DEPAKOTE) 500 MG 24 hr tablet Take 1,500 mg by mouth daily.     . pantoprazole (PROTONIX) 40 MG tablet Take 40 mg by mouth daily.  No results found for this or any previous visit (from the past 48 hour(s)).  No results found.   Pertinent items are noted in HPI.  Height 5' 9.5" (1.765 m), weight 106.1 kg (234 lb).  General appearance: alert, cooperative and appears stated age Head: Normocephalic, without obvious abnormality Neck: no JVD Resp: clear to auscultation bilaterally Cardio: regular rate and rhythm, S1, S2 normal, no murmur, click, rub or gallop GI: soft, non-tender; bowel sounds normal; no masses,  no organomegaly Extremities: ulnar wrist pain right Pulses: 2+ and symmetric Skin: Skin color, texture, turgor normal. No rashes or lesions Neurologic: Grossly normal Incision/Wound: na  Assessment/Plan Assessment:   1. Tear of lunotriquetral ligament, right, initial encounter    Plan: Discussed arthroscopically inspecting this with debridement and possible shrinkage. He is advised that this not resolve symptoms for him than a ulnar shortening osteotomy may be done. Hopefully the debridement possible shrinkage followed by proper treatment will allow this to settle down for him. He would like to proceed. He is very aware that there is no guarantee to the surgery the possibility of infection recurrence injury to arteries nerves tendons complete relief symptoms and dystrophy. He is scheduled for arthroscopy of right wrist with debridement possible fracture lunotriquetral tear.      Drexel Ivey R 01/15/2017, 10:54 AM

## 2017-01-15 NOTE — Anesthesia Preprocedure Evaluation (Addendum)
Anesthesia Evaluation  Patient identified by MRN, date of birth, ID band Patient awake    Reviewed: Allergy & Precautions, NPO status , Patient's Chart, lab work & pertinent test results  History of Anesthesia Complications Negative for: history of anesthetic complications  Airway Mallampati: II  TM Distance: >3 FB Neck ROM: Full    Dental  (+) Missing, Poor Dentition   Pulmonary neg pulmonary ROS,    breath sounds clear to auscultation       Cardiovascular hypertension, Pt. on medications (-) angina(-) Past MI and (-) CHF  Rhythm:Regular     Neuro/Psych PSYCHIATRIC DISORDERS Anxiety Bipolar Disorder negative neurological ROS     GI/Hepatic Neg liver ROS, PUD, GERD  Medicated and Controlled,  Endo/Other  Morbid obesity  Renal/GU negative Renal ROS     Musculoskeletal   Abdominal   Peds  Hematology negative hematology ROS (+)   Anesthesia Other Findings   Reproductive/Obstetrics                             Anesthesia Physical Anesthesia Plan  ASA: II  Anesthesia Plan: Regional and General   Post-op Pain Management:    Induction: Intravenous  Airway Management Planned: LMA  Additional Equipment: None  Intra-op Plan:   Post-operative Plan:   Informed Consent: I have reviewed the patients History and Physical, chart, labs and discussed the procedure including the risks, benefits and alternatives for the proposed anesthesia with the patient or authorized representative who has indicated his/her understanding and acceptance.   Dental advisory given  Plan Discussed with: CRNA and Surgeon  Anesthesia Plan Comments:        Anesthesia Quick Evaluation

## 2017-01-15 NOTE — Progress Notes (Signed)
Assisted Dr. Moser with right, ultrasound guided, axillary block. Side rails up, monitors on throughout procedure. See vital signs in flow sheet. Tolerated Procedure well. 

## 2017-01-15 NOTE — Anesthesia Procedure Notes (Signed)
Procedure Name: LMA Insertion Date/Time: 01/15/2017 12:49 PM Performed by: Burna CashONRAD, Jalesia Loudenslager C Pre-anesthesia Checklist: Patient identified, Emergency Drugs available, Suction available and Patient being monitored Patient Re-evaluated:Patient Re-evaluated prior to inductionOxygen Delivery Method: Circle system utilized Preoxygenation: Pre-oxygenation with 100% oxygen Intubation Type: IV induction Ventilation: Mask ventilation without difficulty LMA: LMA inserted LMA Size: 5.0 Number of attempts: 1 Airway Equipment and Method: Bite block Placement Confirmation: positive ETCO2 Tube secured with: Tape Dental Injury: Teeth and Oropharynx as per pre-operative assessment

## 2017-01-15 NOTE — Anesthesia Procedure Notes (Signed)
Anesthesia Regional Block: Axillary brachial plexus block   Pre-Anesthetic Checklist: ,, timeout performed, Correct Patient, Correct Site, Correct Laterality, Correct Procedure, Correct Position, site marked, Risks and benefits discussed,  Surgical consent,  Pre-op evaluation,  At surgeon's request and post-op pain management  Laterality: Upper and Right  Prep: chloraprep       Needles:  Injection technique: Single-shot  Needle Type: Echogenic Needle          Additional Needles:   Procedures: ultrasound guided,,,,,,,,  Narrative:  Start time: 01/15/2017 11:59 AM End time: 01/15/2017 12:06 PM Injection made incrementally with aspirations every 5 mL.  Performed by: Personally   Additional Notes: H+P and labs reviewed, risks and benefits discussed with patient, procedure tolerated well without complications

## 2017-01-15 NOTE — Discharge Instructions (Addendum)
Post Anesthesia Home Care Instructions  Activity: Get plenty of rest for the remainder of the day. A responsible individual must stay with you for 24 hours following the procedure.  For the next 24 hours, DO NOT: -Drive a car -Operate machinery -Drink alcoholic beverages -Take any medication unless instructed by your physician -Make any legal decisions or sign important papers.  Meals: Start with liquid foods such as gelatin or soup. Progress to regular foods as tolerated. Avoid greasy, spicy, heavy foods. If nausea and/or vomiting occur, drink only clear liquids until the nausea and/or vomiting subsides. Call your physician if vomiting continues.  Special Instructions/Symptoms: Your throat may feel dry or sore from the anesthesia or the breathing tube placed in your throat during surgery. If this causes discomfort, gargle with warm salt water. The discomfort should disappear within 24 hours.  If you had a scopolamine patch placed behind your ear for the management of post- operative nausea and/or vomiting:  1. The medication in the patch is effective for 72 hours, after which it should be removed.  Wrap patch in a tissue and discard in the trash. Wash hands thoroughly with soap and water. 2. You may remove the patch earlier than 72 hours if you experience unpleasant side effects which may include dry mouth, dizziness or visual disturbances. 3. Avoid touching the patch. Wash your hands with soap and water after contact with the patch.      Regional Anesthesia Blocks  1. Numbness or the inability to move the "blocked" extremity may last from 3-48 hours after placement. The length of time depends on the medication injected and your individual response to the medication. If the numbness is not going away after 48 hours, call your surgeon.  2. The extremity that is blocked will need to be protected until the numbness is gone and the  Strength has returned. Because you cannot feel it, you  will need to take extra care to avoid injury. Because it may be weak, you may have difficulty moving it or using it. You may not know what position it is in without looking at it while the block is in effect.  3. For blocks in the legs and feet, returning to weight bearing and walking needs to be done carefully. You will need to wait until the numbness is entirely gone and the strength has returned. You should be able to move your leg and foot normally before you try and bear weight or walk. You will need someone to be with you when you first try to ensure you do not fall and possibly risk injury.  4. Bruising and tenderness at the needle site are common side effects and will resolve in a few days.  5. Persistent numbness or new problems with movement should be communicated to the surgeon or the [facility] ([phone])/ [facility] ([phone]).    Hand Center Instructions Hand Surgery  Wound Care: Keep your hand elevated above the level of your heart.  Do not allow it to dangle by your side.  Keep the dressing dry and do not remove it unless your doctor advises you to do so.  He will usually change it at the time of your post-op visit.  Moving your fingers is advised to stimulate circulation but will depend on the site of your surgery.  If you have a splint applied, your doctor will advise you regarding movement.  Activity: Do not drive or operate machinery today.  Rest today and   then you may return to your normal activity and work as indicated by your physician.  Diet:  Drink liquids today or eat a light diet.  You may resume a regular diet tomorrow.    General expectations: Pain for two to three days. Fingers may become slightly swollen.  Call your doctor if any of the following occur: Severe pain not relieved by pain medication. Elevated temperature. Dressing soaked with blood. Inability to move fingers. White or bluish color to fingers.  

## 2017-01-15 NOTE — Transfer of Care (Signed)
Immediate Anesthesia Transfer of Care Note  Patient: [name]  Procedure(s) Performed: Procedure(s) with comments: RIGHT WRIST ARTHROSCOPY WITH DEBRIDEMENT AND SHRINKAGE LUNOTRIQUETRAL TEAR (Right) - AXILLARY BLOCK  Patient Location: PACU  Anesthesia Type:GA combined with regional for post-op pain  Level of Consciousness: sedated  Airway & Oxygen Therapy: Patient Spontanous Breathing and Patient connected to face mask oxygen  Post-op Assessment: Report given to RN and Post -op Vital signs reviewed and stable  Post vital signs: Reviewed and stable  Last Vitals:  Vitals:   01/15/17 1203 01/15/17 1204  BP:    Pulse: 79 81  Resp: 14 (!) 21  Temp:      Last Pain:  Vitals:   01/15/17 1119  TempSrc: Oral  PainSc: 7          Complications: No apparent anesthesia complications

## 2017-01-16 ENCOUNTER — Encounter (HOSPITAL_BASED_OUTPATIENT_CLINIC_OR_DEPARTMENT_OTHER): Payer: Self-pay | Admitting: Orthopedic Surgery

## 2017-01-16 NOTE — Op Note (Signed)
NAME:  Thelma CompETTIGREW, Nichlos          ACCOUNT NO.:  [id number]  MEDICAL RECORD NO.:  [id number]  LOCATION:                                 FACILITY:  PHYSICIAN:  Cindee SaltGary Chue Berkovich, M.D.            DATE OF BIRTH:  DATE OF PROCEDURE:  01/15/2017 DATE OF DISCHARGE:                              OPERATIVE REPORT   PREOPERATIVE DIAGNOSIS:  Lunotriquetral tear, right wrist.  POSTOPERATIVE DIAGNOSIS:  Lunotriquetral tear, right wrist.  OPERATION:  Debridement of lunotriquetral tear, shrinkage of volar capsule, right wrist.  SURGEON:  Cindee SaltGary Eugenia Eldredge, M.D.  ASSISTANT:  None.  ANESTHESIA:  Supraclavicular block, general.  ANESTHESIOLOGIST:  Christopher P. Maple HudsonMoser, M.D.  HISTORY:  The patient is a 46 year old male with a history of pain, injury of his right wrist, this has not responded to conservative treatment.  He has had an MRI done revealing a lunotriquetral tear.  He is admitted for debridement, possible shrinkage.  He is aware that this may not fully resolve symptoms and an ulnar shortening osteotomy may be required.  Pre, peri and postoperative course have been discussed.  He is well aware that there is no guarantee.  In the preoperative area, the patient is seen, the extremity marked by both the patient and surgeon, antibiotic given.  PROCEDURE IN DETAIL:  The patient was brought to the operating room and after a supraclavicular block was carried out in the preoperative area, he was prepped using ChloraPrep in a supine position with right arm free.  A 3-minute dry time was allowed, and time-out taken, confirming the patient and procedure.  The right limb was placed in the Concept Arc tower, 10 pounds of traction applied.  A 22-gauge needle was used to inflate the joint through the 3-4 portal.  A transverse incision was made.  This was deepened with a blunt hemostat and a blunt trocar was used to enter the joint.  The scope was introduced in the 3-4 portal and inspection of the joint on the  radial side revealed no articular damage. The volar radial wrist ligaments were intact, scapholunate ligament was intact.  There was no changes on the articular surface of the proximal row either proximally or distally.  The triangular fibrocartilage was visualized, there was no tear.  An irrigation catheter was placed in 6U. A 4-5 portal was opened after localization with 22-gauge needle. Transverse incision was made, deepened with a hemostat.  A blunt trocar was used to enter the joint.  A probe was then introduced, the tear of the lunotriquetral joint was immediately apparent, in that, a large flap could be pulled down from the attachment of the triquetrum.  The probe was removed and the scope introduced in the 4-5 portal.  An inspection of the tear was then performed.  This was visualized.  The scope reintroduced into the 3-4 portal.  The full radius shaver 2-mm was then inserted in the 4-5 portal and debridement of the lunotriquetral ligament was performed.  A partial synovectomy was performed on the dorsal aspect.  An ArthroWand was then inserted and a shrinkage of the volar ligaments was then performed along with a further debridement of lunotriquetral joint with the ArthroWand.  The shaver was reintroduced. Any fronds of tissue, which had not shrunk down properly were removed with the full radius shaver.  The midcarpal ulnar portal was localized with a 22-gauge needle.  A transverse incision made.  A hemostat was then used for entrance into the joint along with a blunt trocar.  The midcarpal joint was then inspected, changes were present on the proximal hamate, no changes were present on the distal row.  There was instability of lunotriquetral joint.  The STT joint was able to be visualized and showed no arthritic change.  The instruments were removed.  The portals were closed with interrupted 4-0 nylon sutures.  A sterile compressive dressing and volar splint were applied.   The tourniquet was inflated at the very end of the procedures secondary to damage to a dorsal vein, which was twisted for closure.  The patient tolerated the procedure well.  He was taken to the recovery room for observation, and after deflation of the tourniquet, revealed all fingers immediately pinking.  He will be discharged to home to return to the [facility] in 1 week, on [place].          ______________________________ Cindee Salt, M.D.     GK/MEDQ  D:  01/15/2017  T:  01/16/2017  Job:  [id number]

## 2017-11-10 ENCOUNTER — Other Ambulatory Visit: Payer: Self-pay | Admitting: Orthopedic Surgery

## 2017-11-30 ENCOUNTER — Encounter (HOSPITAL_BASED_OUTPATIENT_CLINIC_OR_DEPARTMENT_OTHER): Payer: Self-pay | Admitting: *Deleted

## 2017-12-01 ENCOUNTER — Encounter (HOSPITAL_BASED_OUTPATIENT_CLINIC_OR_DEPARTMENT_OTHER): Payer: Self-pay | Admitting: *Deleted

## 2017-12-01 ENCOUNTER — Other Ambulatory Visit: Payer: Self-pay

## 2017-12-03 ENCOUNTER — Encounter (HOSPITAL_BASED_OUTPATIENT_CLINIC_OR_DEPARTMENT_OTHER)
Admission: RE | Disposition: A | Discharge status: Home / Self Care | Payer: Self-pay | Source: Ambulatory Visit | Attending: Orthopedic Surgery

## 2017-12-03 ENCOUNTER — Ambulatory Visit (HOSPITAL_BASED_OUTPATIENT_CLINIC_OR_DEPARTMENT_OTHER): Payer: No Typology Code available for payment source | Admitting: Anesthesiology

## 2017-12-03 ENCOUNTER — Other Ambulatory Visit: Payer: Self-pay

## 2017-12-03 ENCOUNTER — Encounter (HOSPITAL_BASED_OUTPATIENT_CLINIC_OR_DEPARTMENT_OTHER): Payer: Self-pay | Admitting: Anesthesiology

## 2017-12-03 ENCOUNTER — Ambulatory Visit (HOSPITAL_BASED_OUTPATIENT_CLINIC_OR_DEPARTMENT_OTHER)
Admission: RE | Admit: 2017-12-03 | Discharge: 2017-12-03 | Disposition: A | Discharge status: Home / Self Care | Payer: No Typology Code available for payment source | Source: Ambulatory Visit | Attending: Orthopedic Surgery | Admitting: Orthopedic Surgery

## 2017-12-03 DIAGNOSIS — Z8711 Personal history of peptic ulcer disease: Secondary | ICD-10-CM | POA: Diagnosis not present

## 2017-12-03 DIAGNOSIS — F909 Attention-deficit hyperactivity disorder, unspecified type: Secondary | ICD-10-CM | POA: Diagnosis not present

## 2017-12-03 DIAGNOSIS — K219 Gastro-esophageal reflux disease without esophagitis: Secondary | ICD-10-CM | POA: Insufficient documentation

## 2017-12-03 DIAGNOSIS — Z8614 Personal history of Methicillin resistant Staphylococcus aureus infection: Secondary | ICD-10-CM | POA: Insufficient documentation

## 2017-12-03 DIAGNOSIS — Z886 Allergy status to analgesic agent status: Secondary | ICD-10-CM | POA: Insufficient documentation

## 2017-12-03 DIAGNOSIS — E119 Type 2 diabetes mellitus without complications: Secondary | ICD-10-CM | POA: Insufficient documentation

## 2017-12-03 DIAGNOSIS — Z888 Allergy status to other drugs, medicaments and biological substances status: Secondary | ICD-10-CM | POA: Diagnosis not present

## 2017-12-03 DIAGNOSIS — Z7984 Long term (current) use of oral hypoglycemic drugs: Secondary | ICD-10-CM | POA: Insufficient documentation

## 2017-12-03 DIAGNOSIS — S63391A Traumatic rupture of other ligament of right wrist, initial encounter: Secondary | ICD-10-CM | POA: Diagnosis not present

## 2017-12-03 DIAGNOSIS — E785 Hyperlipidemia, unspecified: Secondary | ICD-10-CM | POA: Diagnosis not present

## 2017-12-03 DIAGNOSIS — F319 Bipolar disorder, unspecified: Secondary | ICD-10-CM | POA: Insufficient documentation

## 2017-12-03 DIAGNOSIS — Z6831 Body mass index (BMI) 31.0-31.9, adult: Secondary | ICD-10-CM | POA: Diagnosis not present

## 2017-12-03 DIAGNOSIS — Z880 Allergy status to penicillin: Secondary | ICD-10-CM | POA: Insufficient documentation

## 2017-12-03 DIAGNOSIS — Z885 Allergy status to narcotic agent status: Secondary | ICD-10-CM | POA: Diagnosis not present

## 2017-12-03 DIAGNOSIS — S63591A Other specified sprain of right wrist, initial encounter: Secondary | ICD-10-CM | POA: Diagnosis present

## 2017-12-03 DIAGNOSIS — Z8249 Family history of ischemic heart disease and other diseases of the circulatory system: Secondary | ICD-10-CM | POA: Insufficient documentation

## 2017-12-03 DIAGNOSIS — X501XXA Overexertion from prolonged static or awkward postures, initial encounter: Secondary | ICD-10-CM | POA: Diagnosis not present

## 2017-12-03 DIAGNOSIS — F419 Anxiety disorder, unspecified: Secondary | ICD-10-CM | POA: Insufficient documentation

## 2017-12-03 HISTORY — DX: Unspecified injury of other specified muscles, fascia and tendons at wrist and hand level, right hand, initial encounter: S66.801A

## 2017-12-03 HISTORY — PX: WRIST OSTEOTOMY: SHX1093

## 2017-12-03 HISTORY — DX: Attention-deficit hyperactivity disorder, unspecified type: F90.9

## 2017-12-03 HISTORY — PX: ULNA OSTEOTOMY: SHX1077

## 2017-12-03 HISTORY — DX: Type 2 diabetes mellitus without complications: E11.9

## 2017-12-03 LAB — POCT I-STAT, CHEM 8
BUN: 7 mg/dL (ref 6–20)
CALCIUM ION: 1.02 mmol/L — AB (ref 1.15–1.40)
Chloride: 105 mmol/L (ref 101–111)
Creatinine, Ser: 0.8 mg/dL (ref 0.61–1.24)
GLUCOSE: 146 mg/dL — AB (ref 65–99)
HCT: 45 % (ref 39.0–52.0)
HEMOGLOBIN: 15.3 g/dL (ref 13.0–17.0)
Potassium: 4.2 mmol/L (ref 3.5–5.1)
Sodium: 140 mmol/L (ref 135–145)
TCO2: 27 mmol/L (ref 22–32)

## 2017-12-03 LAB — GLUCOSE, CAPILLARY: Glucose-Capillary: 135 mg/dL — ABNORMAL HIGH (ref 65–99)

## 2017-12-03 SURGERY — SHORTENING, ULNA
Anesthesia: General | Site: Wrist | Laterality: Right

## 2017-12-03 MED ORDER — FENTANYL CITRATE (PF) 100 MCG/2ML IJ SOLN
50.0000 ug | INTRAMUSCULAR | Status: DC | PRN
  Administered 2017-12-03: 100 ug via INTRAVENOUS

## 2017-12-03 MED ORDER — DEXAMETHASONE SODIUM PHOSPHATE 10 MG/ML IJ SOLN
INTRAMUSCULAR | Status: AC
  Filled 2017-12-03: qty 1

## 2017-12-03 MED ORDER — PROPOFOL 10 MG/ML IV BOLUS
INTRAVENOUS | Status: AC
  Filled 2017-12-03: qty 20

## 2017-12-03 MED ORDER — CLINDAMYCIN PHOSPHATE 900 MG/50ML IV SOLN
900.0000 mg | INTRAVENOUS | Status: AC
  Administered 2017-12-03: 900 mg via INTRAVENOUS

## 2017-12-03 MED ORDER — HYDROMORPHONE HCL 1 MG/ML IJ SOLN: 0.2500 mg | INTRAMUSCULAR | Status: DC | PRN

## 2017-12-03 MED ORDER — PROPOFOL 10 MG/ML IV BOLUS
INTRAVENOUS | Status: DC | PRN
  Administered 2017-12-03: 200 mg via INTRAVENOUS

## 2017-12-03 MED ORDER — LIDOCAINE HCL (CARDIAC) 20 MG/ML IV SOLN
INTRAVENOUS | Status: DC | PRN
  Administered 2017-12-03: 10 mg via INTRAVENOUS

## 2017-12-03 MED ORDER — OXYCODONE-ACETAMINOPHEN 7.5-325 MG PO TABS: 1.0000 | ORAL_TABLET | ORAL | 0 refills | Status: DC | PRN

## 2017-12-03 MED ORDER — MIDAZOLAM HCL 2 MG/2ML IJ SOLN
1.0000 mg | INTRAMUSCULAR | Status: DC | PRN
  Administered 2017-12-03: 2 mg via INTRAVENOUS

## 2017-12-03 MED ORDER — MIDAZOLAM HCL 2 MG/2ML IJ SOLN
INTRAMUSCULAR | Status: AC
  Filled 2017-12-03: qty 2

## 2017-12-03 MED ORDER — ROPIVACAINE HCL 7.5 MG/ML IJ SOLN
INTRAMUSCULAR | Status: DC | PRN
  Administered 2017-12-03: 20 mL via PERINEURAL

## 2017-12-03 MED ORDER — PHENYLEPHRINE HCL 10 MG/ML IJ SOLN
INTRAMUSCULAR | Status: DC | PRN
  Administered 2017-12-03 (×7): 80 ug via INTRAVENOUS

## 2017-12-03 MED ORDER — SCOPOLAMINE 1 MG/3DAYS TD PT72: 1.0000 | MEDICATED_PATCH | Freq: Once | TRANSDERMAL | Status: DC | PRN

## 2017-12-03 MED ORDER — FENTANYL CITRATE (PF) 100 MCG/2ML IJ SOLN
INTRAMUSCULAR | Status: AC
  Filled 2017-12-03: qty 2

## 2017-12-03 MED ORDER — MIDAZOLAM HCL 2 MG/2ML IJ SOLN: 0.5000 mg | Freq: Once | INTRAMUSCULAR | Status: DC | PRN

## 2017-12-03 MED ORDER — CHLORHEXIDINE GLUCONATE 4 % EX LIQD: 60.0000 mL | Freq: Once | CUTANEOUS | Status: DC

## 2017-12-03 MED ORDER — FENTANYL CITRATE (PF) 100 MCG/2ML IJ SOLN
INTRAMUSCULAR | Status: DC | PRN
  Administered 2017-12-03: 100 ug via INTRAVENOUS

## 2017-12-03 MED ORDER — ONDANSETRON HCL 4 MG/2ML IJ SOLN
INTRAMUSCULAR | Status: AC
  Filled 2017-12-03: qty 2

## 2017-12-03 MED ORDER — PHENYLEPHRINE 40 MCG/ML (10ML) SYRINGE FOR IV PUSH (FOR BLOOD PRESSURE SUPPORT)
PREFILLED_SYRINGE | INTRAVENOUS | Status: AC
  Filled 2017-12-03: qty 10

## 2017-12-03 MED ORDER — PROMETHAZINE HCL 25 MG/ML IJ SOLN: 6.2500 mg | INTRAMUSCULAR | Status: DC | PRN

## 2017-12-03 MED ORDER — DEXAMETHASONE SODIUM PHOSPHATE 4 MG/ML IJ SOLN
INTRAMUSCULAR | Status: DC | PRN
  Administered 2017-12-03: 10 mg via INTRAVENOUS

## 2017-12-03 MED ORDER — MIDAZOLAM HCL 5 MG/5ML IJ SOLN
INTRAMUSCULAR | Status: DC | PRN
  Administered 2017-12-03 (×2): 1 mg via INTRAVENOUS

## 2017-12-03 MED ORDER — LACTATED RINGERS IV SOLN
INTRAVENOUS | Status: DC
  Administered 2017-12-03 (×2): via INTRAVENOUS

## 2017-12-03 MED ORDER — CLINDAMYCIN PHOSPHATE 900 MG/50ML IV SOLN
INTRAVENOUS | Status: AC
  Filled 2017-12-03: qty 50

## 2017-12-03 MED ORDER — LIDOCAINE HCL (CARDIAC) 20 MG/ML IV SOLN
INTRAVENOUS | Status: AC
  Filled 2017-12-03: qty 5

## 2017-12-03 MED ORDER — MEPERIDINE HCL 25 MG/ML IJ SOLN: 6.2500 mg | INTRAMUSCULAR | Status: DC | PRN

## 2017-12-03 MED ORDER — ONDANSETRON HCL 4 MG/2ML IJ SOLN
INTRAMUSCULAR | Status: DC | PRN
  Administered 2017-12-03: 4 mg via INTRAVENOUS

## 2017-12-03 SURGICAL SUPPLY — 92 items
BAG DECANTER FOR FLEXI CONT (MISCELLANEOUS) IMPLANT
BIT DRILL 2.8X5 QR DISP (BIT) ×3 IMPLANT
BIT DRILL QUICK RELEASE 3.5MM (BIT) ×1 IMPLANT
BLADE AVERAGE 25MMX9MM (BLADE) ×1
BLADE AVERAGE 25X9 (BLADE) ×2 IMPLANT
BLADE MINI RND TIP GREEN BEAV (BLADE) IMPLANT
BLADE SAW OSTEOTOMY (BLADE) ×3 IMPLANT
BLADE SURG 15 STRL LF DISP TIS (BLADE) ×2 IMPLANT
BLADE SURG 15 STRL SS (BLADE) ×4
BNDG COHESIVE 3X5 TAN STRL LF (GAUZE/BANDAGES/DRESSINGS) ×6 IMPLANT
BNDG ESMARK 4X9 LF (GAUZE/BANDAGES/DRESSINGS) ×3 IMPLANT
BNDG GAUZE ELAST 4 BULKY (GAUZE/BANDAGES/DRESSINGS) ×3 IMPLANT
BONE TRINITY ELITE 1.2CC SM (Bone Implant) ×3 IMPLANT
BUR EGG 3PK/BX (BURR) IMPLANT
CANISTER SUCT 1200ML W/VALVE (MISCELLANEOUS) IMPLANT
CHLORAPREP W/TINT 26ML (MISCELLANEOUS) ×3 IMPLANT
CORD BIPOLAR FORCEPS 12FT (ELECTRODE) ×3 IMPLANT
COVER BACK TABLE 60X90IN (DRAPES) ×3 IMPLANT
COVER MAYO STAND STRL (DRAPES) ×3 IMPLANT
CUFF TOURNIQUET SINGLE 18IN (TOURNIQUET CUFF) IMPLANT
DECANTER SPIKE VIAL GLASS SM (MISCELLANEOUS) IMPLANT
DRAIN TLS ROUND 10FR (DRAIN) IMPLANT
DRAPE EXTREMITY T 121X128X90 (DRAPE) ×3 IMPLANT
DRAPE INCISE IOBAN 66X45 STRL (DRAPES) IMPLANT
DRAPE LAPAROTOMY 100X72 PEDS (DRAPES) IMPLANT
DRAPE OEC MINIVIEW 54X84 (DRAPES) ×3 IMPLANT
DRAPE SURG 17X23 STRL (DRAPES) ×3 IMPLANT
DRILL QUICK RELEASE 3.5MM (BIT) ×3
DRSG PAD ABDOMINAL 8X10 ST (GAUZE/BANDAGES/DRESSINGS) IMPLANT
ELECT REM PT RETURN 9FT ADLT (ELECTROSURGICAL)
ELECTRODE REM PT RTRN 9FT ADLT (ELECTROSURGICAL) IMPLANT
GAUZE SPONGE 4X4 12PLY STRL (GAUZE/BANDAGES/DRESSINGS) ×3 IMPLANT
GAUZE SPONGE 4X4 16PLY XRAY LF (GAUZE/BANDAGES/DRESSINGS) ×3 IMPLANT
GAUZE XEROFORM 1X8 LF (GAUZE/BANDAGES/DRESSINGS) ×6 IMPLANT
GLOVE BIO SURGEON STRL SZ 6.5 (GLOVE) ×6 IMPLANT
GLOVE BIO SURGEONS STRL SZ 6.5 (GLOVE) ×3
GLOVE BIOGEL PI IND STRL 7.0 (GLOVE) ×4 IMPLANT
GLOVE BIOGEL PI IND STRL 8.5 (GLOVE) ×1 IMPLANT
GLOVE BIOGEL PI INDICATOR 7.0 (GLOVE) ×8
GLOVE BIOGEL PI INDICATOR 8.5 (GLOVE) ×2
GLOVE ECLIPSE 6.5 STRL STRAW (GLOVE) ×3 IMPLANT
GLOVE SURG ORTHO 8.0 STRL STRW (GLOVE) ×3 IMPLANT
GOWN STRL REUS W/ TWL LRG LVL3 (GOWN DISPOSABLE) ×3 IMPLANT
GOWN STRL REUS W/TWL LRG LVL3 (GOWN DISPOSABLE) ×6
GOWN STRL REUS W/TWL XL LVL3 (GOWN DISPOSABLE) ×3 IMPLANT
GUIDEWIRE ORTHO 0.054X6 (WIRE) ×6 IMPLANT
LOOP VESSEL MAXI BLUE (MISCELLANEOUS) IMPLANT
NS IRRIG 1000ML POUR BTL (IV SOLUTION) ×3 IMPLANT
PACK BASIN DAY SURGERY FS (CUSTOM PROCEDURE TRAY) ×3 IMPLANT
PAD CAST 3X4 CTTN HI CHSV (CAST SUPPLIES) ×1 IMPLANT
PAD CAST 4YDX4 CTTN HI CHSV (CAST SUPPLIES) ×1 IMPLANT
PADDING CAST ABS 3INX4YD NS (CAST SUPPLIES) ×2
PADDING CAST ABS 4INX4YD NS (CAST SUPPLIES) ×2
PADDING CAST ABS COTTON 3X4 (CAST SUPPLIES) ×1 IMPLANT
PADDING CAST ABS COTTON 4X4 ST (CAST SUPPLIES) ×1 IMPLANT
PADDING CAST COTTON 3X4 STRL (CAST SUPPLIES) ×2
PADDING CAST COTTON 4X4 STRL (CAST SUPPLIES) ×2
PENCIL BUTTON HOLSTER BLD 10FT (ELECTRODE) IMPLANT
PLATE ULNAR SHORTENING (Plate) ×3 IMPLANT
SCREW HEX 3.5X15 NLCKG STRL (Screw) ×1 IMPLANT
SCREW HEX 3.5X15MM (Screw) ×3 IMPLANT
SCREW HEXALOBE NON-LOCK 3.5X14 (Screw) ×3 IMPLANT
SCREW NLCKG 13 3.5X13 HEXA (Screw) ×1 IMPLANT
SCREW NON-LOCK 3.5X13 (Screw) ×3 IMPLANT
SCREW NONLOCK HEX 3.5X12 (Screw) ×12 IMPLANT
SLEEVE SCD COMPRESS KNEE MED (MISCELLANEOUS) IMPLANT
SLING ARM FOAM STRAP LRG (SOFTGOODS) IMPLANT
SLING ARM MED ADULT FOAM STRAP (SOFTGOODS) IMPLANT
SPLINT PLASTER CAST XFAST 3X15 (CAST SUPPLIES) ×30 IMPLANT
SPLINT PLASTER XTRA FASTSET 3X (CAST SUPPLIES) ×60
SPONGE LAP 4X18 X RAY DECT (DISPOSABLE) IMPLANT
STOCKINETTE 4X48 STRL (DRAPES) ×3 IMPLANT
SUCTION FRAZIER HANDLE 10FR (MISCELLANEOUS) ×2
SUCTION TUBE FRAZIER 10FR DISP (MISCELLANEOUS) ×1 IMPLANT
SUT CHROMIC 4 0 RB 1X27 (SUTURE) IMPLANT
SUT ETHIBOND 3-0 V-5 (SUTURE) IMPLANT
SUT ETHILON 4 0 PS 2 18 (SUTURE) ×3 IMPLANT
SUT MERSILENE 4 0 P 3 (SUTURE) IMPLANT
SUT NOVA NAB GS-22 2 0 T19 (SUTURE) IMPLANT
SUT VIC AB 0 CT1 27 (SUTURE)
SUT VIC AB 0 CT1 27XBRD ANBCTR (SUTURE) IMPLANT
SUT VIC AB 2-0 PS2 27 (SUTURE) ×3 IMPLANT
SUT VIC AB 2-0 SH 27 (SUTURE)
SUT VIC AB 2-0 SH 27XBRD (SUTURE) IMPLANT
SUT VICRYL 4-0 PS2 18IN ABS (SUTURE) ×3 IMPLANT
SYR BULB 3OZ (MISCELLANEOUS) ×3 IMPLANT
SYR CONTROL 10ML LL (SYRINGE) IMPLANT
TOWEL OR 17X24 6PK STRL BLUE (TOWEL DISPOSABLE) ×3 IMPLANT
TUBE CONNECTING 20'X1/4 (TUBING)
TUBE CONNECTING 20X1/4 (TUBING) IMPLANT
UNDERPAD 30X30 (UNDERPADS AND DIAPERS) ×3 IMPLANT
WIRE TACK PLATE PL-PTACK (WIRE) ×3 IMPLANT

## 2017-12-03 NOTE — Anesthesia Preprocedure Evaluation (Addendum)
Anesthesia Evaluation  Patient identified by MRN, date of birth, ID band Patient awake    Reviewed: Allergy & Precautions, NPO status , Patient's Chart, lab work & pertinent test results  History of Anesthesia Complications Negative for: history of anesthetic complications  Airway Mallampati: II  TM Distance: >3 FB Neck ROM: Full    Dental  (+) Dental Advisory Given, Chipped   Pulmonary neg pulmonary ROS,    breath sounds clear to auscultation       Cardiovascular hypertension (not on medication), (-) angina Rhythm:Regular Rate:Normal     Neuro/Psych Anxiety Depression Bipolar Disorder negative neurological ROS     GI/Hepatic Neg liver ROS, GERD  Medicated and Controlled,  Endo/Other  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), Insulin Dependent, Oral Hypoglycemic AgentsMorbid obesity  Renal/GU negative Renal ROS     Musculoskeletal   Abdominal (+) + obese,   Peds  Hematology negative hematology ROS (+)   Anesthesia Other Findings   Reproductive/Obstetrics                            Anesthesia Physical Anesthesia Plan  ASA: II  Anesthesia Plan: General   Post-op Pain Management: GA combined w/ Regional for post-op pain   Induction: Intravenous  PONV Risk Score and Plan: 2 and Ondansetron and Dexamethasone  Airway Management Planned: LMA  Additional Equipment:   Intra-op Plan:   Post-operative Plan:   Informed Consent: I have reviewed the patients History and Physical, chart, labs and discussed the procedure including the risks, benefits and alternatives for the proposed anesthesia with the patient or authorized representative who has indicated his/her understanding and acceptance.   Dental advisory given  Plan Discussed with: CRNA and Surgeon  Anesthesia Plan Comments: (Plan routine monitors, GA- LMA OK, supraclavicular block for post op analgesia)        Anesthesia Quick Evaluation

## 2017-12-03 NOTE — Brief Op Note (Signed)
12/03/2017  12:55 PM  PATIENT:  Shon HoughFranklin R Hebard  47 y.o. male  PRE-OPERATIVE DIAGNOSIS:  UC ABATEMENT RIGHT WRIST  POST-OPERATIVE DIAGNOSIS:  UC ABATEMENT RIGHT WRIST  PROCEDURE:  Procedure(s): ULNAR SHORTENING (Right) RIGHT WRIST OSTEOTOMY (Right)  SURGEON:  Surgeon(s) and Role:    * Cindee SaltKuzma, Wanetta Funderburke, MD - Primary    * Betha LoaKuzma, Kevin, MD - Assisting  PHYSICIAN ASSISTANT:   ASSISTANTS: K Rhyli Depaula,MD   ANESTHESIA:   regional and general  EBL:  5 mL   BLOOD ADMINISTERED:none  DRAINS: none   LOCAL MEDICATIONS USED:  NONE  SPECIMEN:  No Specimen  DISPOSITION OF SPECIMEN:  N/A  COUNTS:  YES  TOURNIQUET:  * Missing tourniquet times found for documented tourniquets in log: [id number] *  DICTATION: .Other Dictation: Dictation Number [id number]  PLAN OF CARE: Discharge to home after PACU  PATIENT DISPOSITION:  PACU - hemodynamically stable.

## 2017-12-03 NOTE — Progress Notes (Signed)
AssistedDr. Carswell Jackson with right, ultrasound guided, supraclavicular block. Side rails up, monitors on throughout procedure. See vital signs in flow sheet. Tolerated Procedure well.  

## 2017-12-03 NOTE — Op Note (Signed)
I assisted Surgeon(s) and Role:    * Cindee SaltKuzma, Gary, MD - Primary    * Betha LoaKuzma, Nanea Jared, MD - Assisting on the Procedure(s): ULNAR SHORTENING RIGHT WRIST OSTEOTOMY on 12/03/2017.  I provided assistance on this case as follows: retraction soft tissues, placement hardware.  Electronically signed by: Tami RibasKUZMA,Colena Ketterman R, MD Date: 12/03/2017 Time: 1:04 PM

## 2017-12-03 NOTE — Transfer of Care (Signed)
Immediate Anesthesia Transfer of Care Note  Patient: [name] [name]  Procedure(s) Performed: ULNAR SHORTENING (Right Wrist) RIGHT WRIST OSTEOTOMY (Right Wrist)  Patient Location: PACU  Anesthesia Type:GA combined with regional for post-op pain  Level of Consciousness: drowsy  Airway & Oxygen Therapy: Patient Spontanous Breathing and Patient connected to face mask oxygen  Post-op Assessment: Report given to RN and Post -op Vital signs reviewed and stable  Post vital signs: Reviewed and stable  Last Vitals:  Vitals Value Taken Time  BP    Temp    Pulse    Resp    SpO2      Last Pain:  Vitals:   12/03/17 0912  TempSrc: Oral  PainSc: 4       Patients Stated Pain Goal: 4 (12/03/17 0912)  Complications: No apparent anesthesia complications

## 2017-12-03 NOTE — Op Note (Signed)
NAME:  [name] [name], [name] [name]          ACCOUNT NO.:  [id number]  MEDICAL RECORD NO.:  [id number]  LOCATION:                                 FACILITY:  PHYSICIAN:  Cindee Salt, M.D.            DATE OF BIRTH:  DATE OF PROCEDURE:  12/03/2017 DATE OF DISCHARGE:                              OPERATIVE REPORT   PREOPERATIVE DIAGNOSIS:  Triangular fibrocartilage complex, lunotriquetral tear; right wrist.  POSTOPERATIVE DIAGNOSIS:  Triangular fibrocartilage complex, lunotriquetral tear; right wrist.  OPERATION:  Ulnar shortening osteotomy Accumed plate, right wrist.  SURGEON:  Cindee Salt, MD.  ASSISTANT:  Betha Loa, MD.  ANESTHESIA:  Supraclavicular block, general.  PLACE OF SURGERY:  [facility].  ANESTHESIOLOGISTJean Rosenthal.  HISTORY:  The patient is a 47 year old male, who sustained a twisting injury to his right wrist.  He has undergone arthroscopy with shrinkage and debridement TFCC in the past, but has not had relief.  He is admitted now for ulnar shortening osteotomy in an effort to tighten the ulnar ligaments.  Pre, peri, and postoperative course have been discussed along with risks and complications.  He is aware there is no guarantee to the surgery; the possibility of infection; recurrence of injury to arteries, nerves, tendons; incomplete relief of symptoms; dystrophy.  In the preoperative area, the patient is seen, the extremity marked by both patient and surgeon.  Antibiotic given.  DESCRIPTION OF PROCEDURE:  The patient was brought to the operating room after a supraclavicular block was carried out without difficulty in the preoperative area, general anesthesia was added to this.  He was prepped and draped in supine position with the right arm free.  A 3-minute dry time was allowed.  Time-out taken, confirmed the patient and procedure. A longitudinal incision was made over the ulnar side of his forearm carried down through subcutaneous tissue.  Bleeders  were electrocauterized.  The dissection was carried down to the ulna.  With blunt and sharp dissection, this was dissected free at the interval between the extensor and flexor of the ulnar wrist.  The periosteum was incised and incision made through the ulnar aspect of the pronator quadratus muscle belly.  The periosteum elevated.  Retractors were placed isolating the entire volar ulna.  An Acumed plate was then placed.  This was positioned on the bone.  A drill hole was made distally and the plate with compression was distinctly off the bone proximally.  The screw was removed.  This measured 10 mm and the plate was bent to adhere to the ulnar shaft over its entire course.  The distal screw was reapplied.  The pins were then placed along with the gliding Screw.  The distal 2 holes were drilled.  An olive was placed priximally, this was done with a clamp placed on the ulna to maintain in position.  The cutting guide was then applied and 2 mm of ulnar shaft was excised using oscillating saw, taking care not to burn the bone.  The  distal screw was placed in the proximal hole of the distal screwholes of the plate.  This was done prior to applying the Shortening peg and clamp, which allowed the  bone to be  Shortened and compressed. Unfortunately, the distal clamp broke, this had to be replaced.  The osteotomy clamps were again applied and osteotomy site closed.  An oblique drill hole was then made this measured 15 mm.  A 15 mm oblique screw was placed.  The fixation screw in the most distal hole of the proximal aspect of the plate was then applied. This measured 14 mm, this firmly stabilized the proximal aspect and the 15 mm screw was tightened firmly compressing the osteotomy site.  The gliding screw was then tightened.  The Olive had been removed to allow ompression.  The most proximal screw was then placed .  The last distal screw was placed fully stabilizing the distal segment.  The most  proximal of the distal screws had been placed before compressing the osteotomy site.  The gliding screw was then replaced with a 14 mm screw.  X-rays in AP and lateral revealed that the osteotomy could not be seen.  The plate lied in good position.  The shortening was adequate.  Full pronation, supination was afforded the patient on examination. The wound was irrigated.  A Trinity bone graft was then applied after rehydrating this according to instructions.  This was packed in the osteotomy site.  The periosteum was closed as much as possible with 2-0 Vicryl sutures, the subcutaneous tissue with interrupted 2-0 Vicryl, and the skin with running 4-0 nylon sutures.  A sterile compressive dressing, Munster splint was applied.  The patient tolerated the procedure well.  On deflation of the tourniquet, all fingers immediately pinked.  He was taken to the recovery room for observation in satisfactory condition.  He will be [facility] in [facility] week, on Percocet.          ______________________________ Cindee SaltGary Chukwuma Straus, M.D.     GK/MEDQ  D:  12/03/2017  T:  12/03/2017  Job:  [id number]

## 2017-12-03 NOTE — Anesthesia Postprocedure Evaluation (Signed)
Anesthesia Post Note  Patient: [name] [name]  Procedure(s) Performed: ULNAR SHORTENING (Right Wrist) RIGHT WRIST OSTEOTOMY (Right Wrist)     Patient location during evaluation: PACU Anesthesia Type: General Level of consciousness: awake and alert Pain management: pain level controlled Vital Signs Assessment: post-procedure vital signs reviewed and stable Respiratory status: spontaneous breathing, nonlabored ventilation and respiratory function stable Cardiovascular status: blood pressure returned to baseline and stable Postop Assessment: no apparent nausea or vomiting Anesthetic complications: no    Last Vitals:  Vitals:   12/03/17 1345 12/03/17 1407  BP: 115/90 126/89  Pulse: 88 (!) 108  Resp: 14 18  Temp:  37 C  SpO2: 93% 94%    Last Pain:  Vitals:   12/03/17 1407  TempSrc:   PainSc: 0-No pain                 Cecile HearingStephen Edward Mecca Guitron

## 2017-12-03 NOTE — H&P (Signed)
[name] [name] is an 47 y.o. male.   Chief Complaint: wrist pain right HPI: [name] [name] is a 47 year old right-hand-dominant male who sustained an injury to his right wrist when he was lifting a 50 pound box this twisted his wrist when it gave way. He complained of pain on the ulnar aspect. He continued to work his wrist began swelling up he was seen to have given Aleve and ice to use he was subsequently referred to the coronal clinic where x-rays were taken and reported negative he was placed in a brace and Voltaren gel he continues to complain pain on the ulnar aspect of his wrist he has a mild throbbing aching pain with a VAS score of 7/10. He complains of pain especially with supination pronation. Localizes over the distal aspect of the ulna. He has a history of a boxer's fracture treated by Dr. Ardine Eng at [facility] with pins. He developed a MRSA infection following that. He has no radiation of his pain. He has been working well with a splint on. He has a traumatic  triangle fibrocartilage complex lunotriquetral tear right wrist. he has had an arthroscopy which has not  relieved symptoms for him. We have recommended ulnar shortening osteotomy. He has been unable to have this done due to his diabetes which is out of control. He has recently had his A1c and glucose taken his A1c is 6.9 his glucose 130. His arthroscopy and shrinkage was done and the beginning of May 2018.    Marland Kitchen He has no history diabetes thyroid problems arthritis but does have a history of gout. Family history is positive diabetes negative for thyroid problems arthritis and gout. His been tested for diabetes.       Past Medical History:  Diagnosis Date  . ADHD   . Anxiety   . Bipolar 1 disorder (HCC)   . Diabetes mellitus without complication (HCC)   . GERD (gastroesophageal reflux disease)   . Hyperlipidemia   . Hypertension    no meds  . Inflammatory bowel disease   . Injury of UCL of right wrist   . PUD (peptic ulcer  disease) 2007   Mild    Past Surgical History:  Procedure Laterality Date  . ESOPHAGOGASTRODUODENOSCOPY  07/03/2006   Normal esophagus without evidence of Barrett's/ Normal duodenum/ Erythema and edema in the antrum /  1. Erythema, edema, and hemorrhage in the cardia likely secondary to   vomiting and retching  . ESOPHAGOGASTRODUODENOSCOPY  07/10/2006   Normal esophagus with patchy areas of fundal erythema and superficial erosion./ Two small prepyloric ulcers all of which had a benign appearance status post biopsy; patent pylorus  . HAND HARDWARE REMOVAL Right 05/26/2013  . INCISION AND DRAINAGE ABSCESS Right 05/26/2013   hand  . OPEN REDUCTION INTERNAL FIXATION (ORIF) METACARPAL Right 04/28/2013   5th metacarpal  . WRIST ARTHROSCOPY WITH DEBRIDEMENT Right 01/15/2017   Procedure: RIGHT WRIST ARTHROSCOPY WITH DEBRIDEMENT AND SHRINKAGE LUNOTRIQUETRAL TEAR;  Surgeon: Cindee Salt, MD;  Location: [facility];  Service: Orthopedics;  Laterality: Right;  AXILLARY BLOCK    Family History  Problem Relation Age of Onset  . Diabetes Mother   . Heart disease Father   . Diabetes Maternal Grandfather    Social History:  reports that he has never smoked. He has never used smokeless tobacco. He reports that he does not drink alcohol or use drugs.  Allergies:  Allergies  Allergen Reactions  . Morphine Anaphylaxis    REACTION: tolerates hydrocodone  .  Ibuprofen     gastritis  . Penicillins   . Adhesive [Tape] Rash    No medications prior to admission.    No results found for this or any previous visit (from the past 48 hour(s)).  No results found.   Pertinent items are noted in HPI.  Height 5' 9.5" (1.765 m), weight 102.1 kg (225 lb).  General appearance: alert, cooperative and appears stated age Head: Normocephalic, without obvious abnormality Neck: no JVD Resp: clear to auscultation bilaterally Cardio: regular rate and rhythm, S1, S2 normal, no murmur, click, rub or  gallop GI: soft, non-tender; bowel sounds normal; no masses,  no organomegaly Extremities: ulnar wrist pain right wrist Pulses: 2+ and symmetric Skin: Skin color, texture, turgor normal. No rashes or lesions Neurologic: Grossly normal Incision/Wound: na  Assessment/Plan Assessment:  1. Traumatic tear of triangular fibrocartilage complex, right 2. Tear of lunotriquetral ligament    Plan: Would recommend an ulnar shortening osteotomy with bone grafting. He is aware that there is no guarantee to the surgery the possibility of infection recurrence injury to arteries nerves tendons complete relief symptoms dystrophy. He is advised the possibility of delayed union. He is advised we may be able to get him into a bone stimulator relatively early after his surgery. He is scheduled for a ulnar shortening osteotomy right wrist and outpatient under regional anesthesia questions are encouraged and answered to his satisfaction.He is out of work at the present time.       Marina Desire R 12/03/2017, 8:20 AM

## 2017-12-03 NOTE — Op Note (Signed)
Other Dictation: Dictation Number [id number]

## 2017-12-03 NOTE — Discharge Instructions (Addendum)

## 2017-12-03 NOTE — Anesthesia Procedure Notes (Signed)
Anesthesia Regional Block: Supraclavicular block   Pre-Anesthetic Checklist: ,, timeout performed, Correct Patient, Correct Site, Correct Laterality, Correct Procedure, Correct Position, site marked, Risks and benefits discussed,  Surgical consent,  Pre-op evaluation,  At surgeon's request and post-op pain management  Laterality: Right and Upper  Prep: chloraprep       Needles:  Injection technique: Single-shot  Needle Type: Echogenic Stimulator Needle     Needle Length: 9cm  Needle Gauge: 21     Additional Needles:   Procedures:, nerve stimulator,,, ultrasound used (permanent image in chart),,,,   Nerve Stimulator or Paresthesia:  Response: forearm twitch, 0.4 mA, 0.1 ms,   Additional Responses:   Narrative:  Start time: 12/03/2017 10:06 AM End time: 12/03/2017 10:15 AM Injection made incrementally with aspirations every 5 mL.  Performed by: Personally  Anesthesiologist: Jairo BenJackson, Churchill Grimsley, MD  Additional Notes: Pt identified in Holding room.  Monitors applied. Working IV access confirmed. Sterile prep, drape R clavicle.  #21ga ECHOgenic/PNS to forearm twitch at 0.2340mA threshold with US guidance.  20cc 0.75% Ropivacaine injected incrementally after negative test dose.  Patient asymptomatic, VSS, no heme aspirated, tolerated well.  Sandford Craze Lusia Greis, MD

## 2017-12-03 NOTE — Anesthesia Procedure Notes (Signed)
Procedure Name: LMA Insertion Date/Time: 12/03/2017 11:34 AM Performed by: Dodson Branch DesanctisLinka, Macy Polio L, CRNA Pre-anesthesia Checklist: Patient identified, Emergency Drugs available, Suction available, Patient being monitored and Timeout performed Patient Re-evaluated:Patient Re-evaluated prior to induction Oxygen Delivery Method: Circle system utilized Preoxygenation: Pre-oxygenation with 100% oxygen Induction Type: IV induction Ventilation: Mask ventilation without difficulty LMA: LMA inserted LMA Size: 5.0 Number of attempts: 1 Airway Equipment and Method: Bite block Placement Confirmation: positive ETCO2 Tube secured with: Tape Dental Injury: Teeth and Oropharynx as per pre-operative assessment

## 2017-12-04 ENCOUNTER — Encounter (HOSPITAL_BASED_OUTPATIENT_CLINIC_OR_DEPARTMENT_OTHER): Payer: Self-pay | Admitting: Orthopedic Surgery

## 2019-11-17 ENCOUNTER — Ambulatory Visit: Payer: HRSA Program | Attending: Internal Medicine

## 2019-11-17 DIAGNOSIS — Z20822 Contact with and (suspected) exposure to covid-19: Secondary | ICD-10-CM | POA: Diagnosis not present

## 2019-11-18 LAB — NOVEL CORONAVIRUS, NAA: SARS-CoV-2, NAA: NOT DETECTED

## 2019-11-22 ENCOUNTER — Ambulatory Visit: Payer: Self-pay | Attending: Internal Medicine

## 2019-11-22 ENCOUNTER — Telehealth: Payer: Self-pay

## 2019-11-22 ENCOUNTER — Other Ambulatory Visit: Payer: Self-pay

## 2019-11-22 DIAGNOSIS — Z20822 Contact with and (suspected) exposure to covid-19: Secondary | ICD-10-CM | POA: Insufficient documentation

## 2019-11-22 NOTE — Telephone Encounter (Signed)
Patient called and was informed that his test for COVID-19 11/17/19 was negative. Patient states he has just had a second test today because his mother is positive and he cares for her. He verbalized understanding of all information and will isolate until most recent test had resulted.

## 2019-11-23 LAB — NOVEL CORONAVIRUS, NAA: SARS-CoV-2, NAA: NOT DETECTED

## 2019-11-24 ENCOUNTER — Telehealth: Payer: Self-pay

## 2019-11-24 NOTE — Telephone Encounter (Signed)
Pt. Called to inquire about COVID results from 11/22/19.  Advised that the results were negative; the virus was not detected.  Pt. Reported he was directly exposed to COVID on 2/27.  Reported he tested negative on 3/11.  Reported he had body aches on 3/15, and decided to retest on 3/16.  Denied any symptoms at this time.

## 2020-07-21 ENCOUNTER — Other Ambulatory Visit: Payer: Self-pay

## 2020-07-21 DIAGNOSIS — Z20822 Contact with and (suspected) exposure to covid-19: Secondary | ICD-10-CM

## 2020-07-22 LAB — NOVEL CORONAVIRUS, NAA: SARS-CoV-2, NAA: NOT DETECTED

## 2020-07-22 LAB — SARS-COV-2, NAA 2 DAY TAT

## 2022-05-17 DIAGNOSIS — L02211 Cutaneous abscess of abdominal wall: Secondary | ICD-10-CM | POA: Diagnosis not present

## 2022-05-17 DIAGNOSIS — L0231 Cutaneous abscess of buttock: Secondary | ICD-10-CM | POA: Diagnosis not present

## 2022-05-17 DIAGNOSIS — E1165 Type 2 diabetes mellitus with hyperglycemia: Secondary | ICD-10-CM | POA: Diagnosis not present

## 2022-05-17 DIAGNOSIS — N4821 Abscess of corpus cavernosum and penis: Secondary | ICD-10-CM | POA: Diagnosis not present

## 2023-05-18 DIAGNOSIS — N4821 Abscess of corpus cavernosum and penis: Secondary | ICD-10-CM | POA: Diagnosis not present

## 2023-05-18 DIAGNOSIS — L02211 Cutaneous abscess of abdominal wall: Secondary | ICD-10-CM | POA: Diagnosis not present

## 2023-05-18 DIAGNOSIS — L0231 Cutaneous abscess of buttock: Secondary | ICD-10-CM | POA: Diagnosis not present

## 2023-05-18 DIAGNOSIS — E1165 Type 2 diabetes mellitus with hyperglycemia: Secondary | ICD-10-CM | POA: Diagnosis not present

## 2023-08-20 DIAGNOSIS — R531 Weakness: Secondary | ICD-10-CM | POA: Diagnosis not present

## 2023-08-20 DIAGNOSIS — R739 Hyperglycemia, unspecified: Secondary | ICD-10-CM | POA: Diagnosis not present

## 2023-08-20 DIAGNOSIS — E1165 Type 2 diabetes mellitus with hyperglycemia: Secondary | ICD-10-CM | POA: Diagnosis not present

## 2023-08-20 DIAGNOSIS — E1159 Type 2 diabetes mellitus with other circulatory complications: Secondary | ICD-10-CM | POA: Diagnosis not present

## 2023-08-20 DIAGNOSIS — R918 Other nonspecific abnormal finding of lung field: Secondary | ICD-10-CM | POA: Diagnosis not present

## 2023-08-20 DIAGNOSIS — R824 Acetonuria: Secondary | ICD-10-CM | POA: Diagnosis not present

## 2023-08-20 DIAGNOSIS — R5381 Other malaise: Secondary | ICD-10-CM | POA: Diagnosis not present

## 2023-08-20 DIAGNOSIS — E1140 Type 2 diabetes mellitus with diabetic neuropathy, unspecified: Secondary | ICD-10-CM | POA: Diagnosis not present

## 2023-08-21 DIAGNOSIS — E1140 Type 2 diabetes mellitus with diabetic neuropathy, unspecified: Secondary | ICD-10-CM | POA: Diagnosis not present

## 2023-08-21 DIAGNOSIS — R739 Hyperglycemia, unspecified: Secondary | ICD-10-CM | POA: Diagnosis not present

## 2023-08-21 DIAGNOSIS — R1032 Left lower quadrant pain: Secondary | ICD-10-CM | POA: Diagnosis not present

## 2023-08-21 DIAGNOSIS — E1165 Type 2 diabetes mellitus with hyperglycemia: Secondary | ICD-10-CM | POA: Diagnosis not present

## 2023-08-21 DIAGNOSIS — R531 Weakness: Secondary | ICD-10-CM | POA: Diagnosis not present

## 2023-08-22 DIAGNOSIS — E1165 Type 2 diabetes mellitus with hyperglycemia: Secondary | ICD-10-CM | POA: Diagnosis not present

## 2023-08-22 DIAGNOSIS — F909 Attention-deficit hyperactivity disorder, unspecified type: Secondary | ICD-10-CM | POA: Diagnosis not present

## 2023-08-22 DIAGNOSIS — R531 Weakness: Secondary | ICD-10-CM | POA: Diagnosis not present

## 2023-08-22 DIAGNOSIS — E1140 Type 2 diabetes mellitus with diabetic neuropathy, unspecified: Secondary | ICD-10-CM | POA: Diagnosis not present

## 2023-08-26 DIAGNOSIS — R739 Hyperglycemia, unspecified: Secondary | ICD-10-CM | POA: Diagnosis not present

## 2023-10-10 DIAGNOSIS — Z419 Encounter for procedure for purposes other than remedying health state, unspecified: Secondary | ICD-10-CM | POA: Diagnosis not present

## 2023-10-13 DIAGNOSIS — F319 Bipolar disorder, unspecified: Secondary | ICD-10-CM | POA: Diagnosis not present

## 2023-10-13 DIAGNOSIS — R001 Bradycardia, unspecified: Secondary | ICD-10-CM | POA: Diagnosis not present

## 2023-10-13 DIAGNOSIS — I10 Essential (primary) hypertension: Secondary | ICD-10-CM | POA: Diagnosis not present

## 2023-10-13 DIAGNOSIS — F909 Attention-deficit hyperactivity disorder, unspecified type: Secondary | ICD-10-CM | POA: Diagnosis not present

## 2023-10-13 DIAGNOSIS — F419 Anxiety disorder, unspecified: Secondary | ICD-10-CM | POA: Diagnosis not present

## 2023-10-13 DIAGNOSIS — E1165 Type 2 diabetes mellitus with hyperglycemia: Secondary | ICD-10-CM | POA: Diagnosis not present

## 2023-10-13 DIAGNOSIS — Z885 Allergy status to narcotic agent status: Secondary | ICD-10-CM | POA: Diagnosis not present

## 2023-10-13 DIAGNOSIS — Z91040 Latex allergy status: Secondary | ICD-10-CM | POA: Diagnosis not present

## 2023-10-13 DIAGNOSIS — Z7982 Long term (current) use of aspirin: Secondary | ICD-10-CM | POA: Diagnosis not present

## 2023-10-13 DIAGNOSIS — R079 Chest pain, unspecified: Secondary | ICD-10-CM | POA: Diagnosis not present

## 2023-10-13 DIAGNOSIS — Z880 Allergy status to penicillin: Secondary | ICD-10-CM | POA: Diagnosis not present

## 2023-10-13 DIAGNOSIS — Z7984 Long term (current) use of oral hypoglycemic drugs: Secondary | ICD-10-CM | POA: Diagnosis not present

## 2023-10-13 DIAGNOSIS — E1142 Type 2 diabetes mellitus with diabetic polyneuropathy: Secondary | ICD-10-CM | POA: Diagnosis not present

## 2023-11-03 DIAGNOSIS — E1165 Type 2 diabetes mellitus with hyperglycemia: Secondary | ICD-10-CM | POA: Diagnosis not present

## 2023-11-03 DIAGNOSIS — Z712 Person consulting for explanation of examination or test findings: Secondary | ICD-10-CM | POA: Diagnosis not present

## 2023-11-03 DIAGNOSIS — Z0131 Encounter for examination of blood pressure with abnormal findings: Secondary | ICD-10-CM | POA: Diagnosis not present

## 2023-11-07 DIAGNOSIS — Z419 Encounter for procedure for purposes other than remedying health state, unspecified: Secondary | ICD-10-CM | POA: Diagnosis not present

## 2023-11-12 ENCOUNTER — Other Ambulatory Visit: Payer: Self-pay | Admitting: Family Medicine

## 2023-11-12 ENCOUNTER — Ambulatory Visit: Payer: Medicaid Other | Admitting: Family Medicine

## 2023-11-12 ENCOUNTER — Encounter: Payer: Self-pay | Admitting: Family Medicine

## 2023-11-12 VITALS — BP 132/84 | HR 100 | Ht 71.0 in | Wt 172.0 lb

## 2023-11-12 DIAGNOSIS — R296 Repeated falls: Secondary | ICD-10-CM | POA: Diagnosis not present

## 2023-11-12 DIAGNOSIS — H33313 Horseshoe tear of retina without detachment, bilateral: Secondary | ICD-10-CM

## 2023-11-12 DIAGNOSIS — F902 Attention-deficit hyperactivity disorder, combined type: Secondary | ICD-10-CM | POA: Diagnosis not present

## 2023-11-12 DIAGNOSIS — F3174 Bipolar disorder, in full remission, most recent episode manic: Secondary | ICD-10-CM

## 2023-11-12 DIAGNOSIS — G2581 Restless legs syndrome: Secondary | ICD-10-CM | POA: Diagnosis not present

## 2023-11-12 DIAGNOSIS — Z0000 Encounter for general adult medical examination without abnormal findings: Secondary | ICD-10-CM

## 2023-11-12 DIAGNOSIS — Z7689 Persons encountering health services in other specified circumstances: Secondary | ICD-10-CM | POA: Diagnosis not present

## 2023-11-12 DIAGNOSIS — E1140 Type 2 diabetes mellitus with diabetic neuropathy, unspecified: Secondary | ICD-10-CM

## 2023-11-12 DIAGNOSIS — E1169 Type 2 diabetes mellitus with other specified complication: Secondary | ICD-10-CM | POA: Insufficient documentation

## 2023-11-12 DIAGNOSIS — R2689 Other abnormalities of gait and mobility: Secondary | ICD-10-CM | POA: Diagnosis not present

## 2023-11-12 DIAGNOSIS — R351 Nocturia: Secondary | ICD-10-CM

## 2023-11-12 DIAGNOSIS — E785 Hyperlipidemia, unspecified: Secondary | ICD-10-CM | POA: Insufficient documentation

## 2023-11-12 DIAGNOSIS — Z794 Long term (current) use of insulin: Secondary | ICD-10-CM | POA: Diagnosis not present

## 2023-11-12 DIAGNOSIS — H548 Legal blindness, as defined in USA: Secondary | ICD-10-CM

## 2023-11-12 MED ORDER — LEVEMIR FLEXPEN 100 UNIT/ML ~~LOC~~ SOPN: 50.0000 [IU] | PEN_INJECTOR | Freq: Two times a day (BID) | SUBCUTANEOUS | 1 refills | Status: DC

## 2023-11-12 MED ORDER — ROPINIROLE HCL 0.5 MG PO TABS: 0.5000 mg | ORAL_TABLET | Freq: Every day | ORAL | 1 refills | Status: DC

## 2023-11-12 MED ORDER — ACCU-CHEK AVIVA PLUS VI STRP: ORAL_STRIP | 3 refills | Status: AC

## 2023-11-12 MED ORDER — GABAPENTIN 600 MG PO TABS: 600.0000 mg | ORAL_TABLET | Freq: Three times a day (TID) | ORAL | 2 refills | Status: DC

## 2023-11-12 MED ORDER — TRULICITY 0.75 MG/0.5ML ~~LOC~~ SOAJ: 0.7500 mg | SUBCUTANEOUS | 5 refills | Status: DC

## 2023-11-12 MED ORDER — ACCU-CHEK AVIVA PLUS W/DEVICE KIT: PACK | 0 refills | Status: AC

## 2023-11-12 MED ORDER — ACCU-CHEK SOFTCLIX LANCETS MISC: 3 refills | Status: AC

## 2023-11-12 NOTE — Patient Instructions (Addendum)
 Thank you for coming to the office today.  New Trulicity rx 0.75mg  weekly - need authorization first  Re order Levemir Flexpen 50 u twice a day  Gabapentin dose increase from 300 to 600mg   Add Ropinirole at night for RLS Can dose increase if need  Cartilage costochondritis / xiphoid  - Consider the Aleve OTC 1-2 per dose twice a day with meal.   Referral to our [facility] Care Management - Juanell Fairly RN and [place] Social work and Estelle Grumbles [facility] pharmacist.   Referral to Neurology for balance disorder  [facility] - Neurology Dept [address] [place], [place] [address] Phone: [phone]  Please schedule a Follow-up Appointment to: Return for 2 month fasting lab > 1 week later Annual Physical.  If you have any other questions or concerns, please feel free to call the office or send a message through MyChart. You may also schedule an earlier appointment if necessary.  Additionally, you may be receiving a survey about your experience at our office within a few days to 1 week by e-mail or mail. We value your feedback.  Saralyn Pilar, DO [facility], [place]

## 2023-11-12 NOTE — Progress Notes (Signed)
 Subjective:    Patient ID: [name] [name], male    DOB: 10/02/1970, 53 y.o.   MRN: [id number]  TAAJ HURLBUT is a 53 y.o. male presenting on 11/12/2023 for Manic Behavior (Bipolar), Fall, and Diabetes   HPI    Discussed the use of AI scribe software for clinical note transcription with the patient, who gave verbal consent to proceed.  History of Present Illness   [name] [name] is a 53 year old male with diabetes who presents with uncontrolled blood sugar and balance issues. He is accompanied by a close friend [name] [name] who he lives with. She provides additional history.  Previous history receiving medical care PCP at [facility]  Type 2 Diabetes, uncontrolled History Retinal Tears / Legal Blindness Diabetic Neuropathy  He has poorly controlled diabetes and is not currently using a glucometer. He was hospitalized in December for diabetic ketoacidosis (DKA) and had a recent episode with mild DKA. His current medications include  Levemir 50u twice a day Metformin 1000mg  twice a day Tradjenta waiting on approval Previously on Jardiance - side effects with UTIs Interested in GLP1. He has Medicaid now, which may allow access to newer medications. Admits poor diet and eating sweet still. Goal to improve diet - He needs new Glucometer for testing.  Balance Disorder Unstable Gait  He has experienced two falls in the last six months due to poor balance, which he attributes to neuropathy and vision issues. He requests a prescription for a cane or walking stick to aid his mobility. He has neuropathy in his feet and legs, and his balance is further compromised by vision problems. 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 He had surgery on his right eye in October, resulting in a 45-50% vision loss, and has complete vision loss in his left eye. Despite these issues, he continues to drive short  distances, although he acknowledges the risks involved.  He has a history of chest pain, which led to a hospital visit about a month ago. Multiple EKGs and troponin tests were performed, all of which were normal. He describes the pain as being located at the xiphoid process, which is tender to touch and exacerbated by certain movements.  He has a history of bipolar disorder and ADHD, which he states are well-managed. He is currently under the care of a psychiatrist, Dr. Raynald Kemp, whom he speaks highly of.  He has a family history of prostate cancer, with both his grandfather and uncle having had the disease. He is concerned about his prostate health and mentions a recent PSA test.          11/12/2023   10:14 AM  Depression screen PHQ 2/9  Decreased Interest 0  Down, Depressed, Hopeless 0  PHQ - 2 Score 0  Altered sleeping 3  Tired, decreased energy 2  Change in appetite 3  Feeling bad or failure about yourself  0  Trouble concentrating 1  Moving slowly or fidgety/restless 1  Suicidal thoughts 0  PHQ-9 Score 10  Difficult doing work/chores Somewhat difficult       11/12/2023   10:14 AM  GAD 7 : Generalized Anxiety Score  Nervous, Anxious, on Edge 0  Control/stop worrying 1  Worry too much - different things 1  Trouble relaxing 1  Restless 2  Easily annoyed or irritable 0  Afraid - awful might happen 0  Total GAD 7 Score 5  Anxiety Difficulty Somewhat difficult     Past Medical History:  Diagnosis Date   ADHD    Anxiety    Bipolar 1 disorder (HCC)    Diabetes mellitus without complication (HCC)    GERD (gastroesophageal reflux disease)    Hyperlipidemia    Hypertension    no meds   Inflammatory bowel disease    Injury of UCL of right wrist    PUD (peptic ulcer disease) 2007   Mild   Past Surgical History:  Procedure Laterality Date   ESOPHAGOGASTRODUODENOSCOPY  07/03/2006   Normal esophagus without evidence of Barrett's/ Normal duodenum/ Erythema and edema in the  antrum /  1. Erythema, edema, and hemorrhage in the cardia likely secondary to   vomiting and retching   ESOPHAGOGASTRODUODENOSCOPY  07/10/2006   Normal esophagus with patchy areas of fundal erythema and superficial erosion./ Two small prepyloric ulcers all of which had a benign appearance status post biopsy; patent pylorus   HAND HARDWARE REMOVAL Right 05/26/2013   INCISION AND DRAINAGE ABSCESS Right 05/26/2013   hand   OPEN REDUCTION INTERNAL FIXATION (ORIF) METACARPAL Right 04/28/2013   5th metacarpal   ULNA OSTEOTOMY Right 12/03/2017   Procedure: ULNAR SHORTENING;  Surgeon: Cindee Salt, MD;  Location: [facility];  Service: Orthopedics;  Laterality: Right;   WRIST ARTHROSCOPY WITH DEBRIDEMENT Right 01/15/2017   Procedure: RIGHT WRIST ARTHROSCOPY WITH DEBRIDEMENT AND SHRINKAGE LUNOTRIQUETRAL TEAR;  Surgeon: Cindee Salt, MD;  Location: [facility];  Service: Orthopedics;  Laterality: Right;  AXILLARY BLOCK   WRIST OSTEOTOMY Right 12/03/2017   Procedure: RIGHT WRIST OSTEOTOMY;  Surgeon: Cindee Salt, MD;  Location: [facility];  Service: Orthopedics;  Laterality: Right;   Social History   Socioeconomic History   Marital status: Divorced    Spouse name: Not on file   Number of children: Not on file   Years of education: Not on file   Highest education level: Not on file  Occupational History   Not on file  Tobacco Use   Smoking status: Never   Smokeless tobacco: Never  Substance and Sexual Activity   Alcohol use: No   Drug use: Not Currently    Types: "Crack" cocaine    Comment: sober since 2008   Sexual activity: Not on file  Other Topics Concern   Not on file  Social History Narrative   Not on file   Social Drivers of Health   Financial Resource Strain: Not on file  Food Insecurity: Not on file  Transportation Needs: Not on file  Physical Activity: Not on file  Stress: Not on file  Social Connections: Not on file  Intimate Partner  Violence: Not on file   Family History  Problem Relation Age of Onset   Diabetes Mother    Depression Mother    Thyroid disease Mother    Heart disease Father    Stroke Father    Diabetes Maternal Grandfather    Current Outpatient Medications on File Prior to Visit  Medication Sig   alprazolam (XANAX) 2 MG tablet Take 2 mg by mouth at bedtime as needed for sleep.   amphetamine-dextroamphetamine (ADDERALL XR) 20 MG 24 hr capsule Take 20 mg by mouth daily.   amphetamine-dextroamphetamine (ADDERALL) 30 MG tablet Take 100 mg by mouth 2 (two) times daily.    tamsulosin (FLOMAX) 0.4 MG CAPS capsule Take 0.4 mg by mouth.   allopurinol (ZYLOPRIM) 300 MG tablet Take 300 mg by mouth daily. (Patient not taking:  Reported on 11/12/2023)   divalproex (DEPAKOTE) 500 MG 24 hr tablet Take 1,500 mg by mouth daily.    metFORMIN (GLUCOPHAGE) 1000 MG tablet Take 1,000 mg by mouth 2 (two) times daily with a meal.   pantoprazole (PROTONIX) 40 MG tablet Take 40 mg by mouth daily.     No current facility-administered medications on file prior to visit.    Review of Systems  Eyes:  Positive for visual disturbance.   Per HPI unless specifically indicated above     Objective:    BP 132/84   Pulse 100   Ht 5\' 11"  (1.803 m)   Wt 172 lb (78 kg)   SpO2 100%   BMI 23.99 kg/m   Wt Readings from Last 3 Encounters:  11/12/23 172 lb (78 kg)  12/03/17 224 lb (101.6 kg)  01/15/17 230 lb (104.3 kg)    Physical Exam Vitals and nursing note reviewed.  Constitutional:      General: He is not in acute distress.    Appearance: Normal appearance. He is well-developed. He is not diaphoretic.     Comments: Well-appearing, comfortable, cooperative  HENT:     Head: Normocephalic and atraumatic.  Eyes:     General:        Right eye: No discharge.        Left eye: No discharge.     Conjunctiva/sclera: Conjunctivae normal.  Cardiovascular:     Rate and Rhythm: Normal rate.  Pulmonary:     Effort: Pulmonary  effort is normal.  Chest:     Chest wall: Tenderness (Xiphoid process / rib cage only tender) present.  Musculoskeletal:     Comments: Unsteady gait. Using family friend to assist with walking  Skin:    General: Skin is warm and dry.     Findings: No erythema or rash.  Neurological:     Mental Status: He is alert and oriented to person, place, and time.  Psychiatric:        Mood and Affect: Mood normal.        Behavior: Behavior normal.        Thought Content: Thought content normal.     Comments: Well groomed, good eye contact, normal speech and thoughts     Diabetic Foot Exam - Simple   Simple Foot Form Diabetic Foot exam was performed with the following findings: Yes 11/12/2023 11:19 AM  Visual Inspection See comments: Yes Sensation Testing See comments: Yes Pulse Check Posterior Tibialis and Dorsalis pulse intact bilaterally: Yes Comments Bilateral callus formation heels and forefoot, and nodules on both feet soles with lipomas. Pes planus bilateral      Results for orders placed or performed in visit on 07/21/20  Novel Coronavirus, NAA ([facility])   Collection Time: 07/21/20  8:57 AM   Specimen: Nasopharyngeal(NP) swabs in vial transport medium   Nasopharynge  Result Value Ref Range   SARS-CoV-2, NAA Not Detected Not Detected  SARS-COV-2, NAA 2 DAY TAT   Collection Time: 07/21/20  8:57 AM   Nasopharynge  Result Value Ref Range   SARS-CoV-2, NAA 2 DAY TAT Performed       Assessment & Plan:   Problem List Items Addressed This Visit     Attention deficit hyperactivity disorder (ADHD), combined type   Bipolar disorder, in full remission, most recent episode manic (HCC)   Relevant Orders   AMB Referral VBCI Care Management   Type 2 diabetes mellitus with diabetic neuropathy, with long-term current use of insulin (HCC) -  Primary   Relevant Medications   LEVEMIR FLEXPEN 100 UNIT/ML FlexPen   TRULICITY 0.75 MG/0.5ML SOAJ   Blood Glucose Monitoring Suppl (ACCU-CHEK  AVIVA PLUS) w/Device KIT   ACCU-CHEK AVIVA PLUS test strip   Accu-Chek Softclix Lancets lancets   gabapentin (NEURONTIN) 600 MG tablet   Other Relevant Orders   Ambulatory referral to Neurology   AMB Referral VBCI Care Management   Other Visit Diagnoses       Encounter to establish care with new doctor         Restless leg syndrome       Relevant Medications   rOPINIRole (REQUIP) 0.5 MG tablet   Other Relevant Orders   Ambulatory referral to Neurology     Balance disorder       Relevant Orders   For home use only DME Other see comment   Ambulatory referral to Neurology   AMB Referral VBCI Care Management     Recurrent falls       Relevant Orders   For home use only DME Other see comment   AMB Referral VBCI Care Management     Retinal tear of both eyes       Relevant Orders   AMB Referral VBCI Care Management     Legal blindness       Relevant Orders   AMB Referral VBCI Care Management        Establish care Review outside records  Uncontrolled Diabetes Mellitus Recent hospitalization for DKA.  Complications, neuropathy, retinal tear / blindness - Re order Levemir 50u TWICE A DAY, Metformin 1000mg  TWICE A DAY - Add new rx Trulicity 0.75mg  weekly GLP1. He can stop Tradjenta. -Rx Accu-chek for glucometer. -Explore eligibility for continuous glucose monitoring system. -Check blood glucose levels twice daily and log results. - Refer to clinical pharmacy + RN  Diabetic Neuropathy Reports significant neuropathy in feet and hands. Currently on Gabapentin 300mg  TID. -Increase Gabapentin to 600mg  TID. -Add Ropinirole at bedtime for restless leg syndrome.  Retinal Detachment History of retinal detachment in both eyes with significant vision loss. Recent surgery on right eye. Follow w/ ophthalmology for further management.  Balance Disorder Reports poor balance with history of falls. Requested prescription for cane or walking stick. -Refer to neurology for further  evaluation and management of balance disorder. -Provide prescription for adjustable height cane or walking stick.  History Chest Pain Xipoid Process Pain Recent episode of chest pain with normal EKG and troponin levels. Pain localized to rib cage cartilage. -Consider Aleve OTC 1-2 per dose twice daily with meals for inflammation. -Consider topical Diclofenac for localized pain relief.  General Health Maintenance -Refer to care management team for additional support. -Order blood work for next visit, including PSA due to family history of prostate cancer. -Consider sleep apnea evaluation due to reported drop in oxygen levels during sleep.      Medical Conditions: - The primary medical condition that impacts patient's mobility need is Balance Disorder, Diabetic Neuropathy, Loss of Vision (Legal blindness, retinal tears). These cause recurrent falls and unsteady gait.  MRADLs impaired in the home include: - Gilmer Mor is necessary for patient's mobility to get to the bathroom for routine toilet use. - Gilmer Mor is necessary for patient's mobility to get to the kitchen to prepare meals - Gilmer Mor is necessary for patient's mobility to get to the bedroom to dress and sleep   Cane or Walker Patient can use a cane / walker that would safely address his mobility impairment and he  is willing to operate this in the home.   Orders Placed This Encounter  Procedures   For home use only DME Other see comment    Cane single point, adjustable height    Length of Need:   Lifetime   Ambulatory referral to Neurology    Referral Priority:   Routine    Referral Type:   Consultation    Referral Reason:   Specialty Services Required    Requested Specialty:   Neurology    Number of Visits Requested:   1   AMB Referral VBCI Care Management    Referral Priority:   Routine    Referral Type:   Consultation    Referral Reason:   Care Coordination    Number of Visits Requested:   1    Meds ordered this encounter   Medications   LEVEMIR FLEXPEN 100 UNIT/ML FlexPen    Sig: Inject 50 Units into the skin 2 (two) times daily.    Dispense:  90 mL    Refill:  1   TRULICITY 0.75 MG/0.5ML SOAJ    Sig: Inject 0.75 mg into the skin once a week.    Dispense:  2 mL    Refill:  5   rOPINIRole (REQUIP) 0.5 MG tablet    Sig: Take 1 tablet (0.5 mg total) by mouth at bedtime.    Dispense:  90 tablet    Refill:  1   Blood Glucose Monitoring Suppl (ACCU-CHEK AVIVA PLUS) w/Device KIT    Sig: Use to check blood sugar twice a day as advised    Dispense:  1 kit    Refill:  0    Accu-chek Aviva Plus Type 2 Diabetes E11.40   ACCU-CHEK AVIVA PLUS test strip    Sig: Use to check blood sugar up to twice a day as advised    Dispense:  200 each    Refill:  3    Accu-chek Aviva Plus Type 2 Diabetes E11.40   Accu-Chek Softclix Lancets lancets    Sig: Use to check blood sugar up to twice a day as advised    Dispense:  200 each    Refill:  3    Accu-chek Aviva Plus Type 2 Diabetes E11.40   gabapentin (NEURONTIN) 600 MG tablet    Sig: Take 1 tablet (600 mg total) by mouth 3 (three) times daily.    Dispense:  90 tablet    Refill:  2    Dose increase from previous 300mg      Follow up plan: Return for 2 month fasting lab > 1 week later Annual Physical.  Return for physical for all labs PSA A1c  Saralyn Pilar, DO [facility] 11/12/2023, 10:40 AM

## 2023-11-16 ENCOUNTER — Telehealth: Payer: Self-pay

## 2023-11-16 NOTE — Progress Notes (Signed)
 Complex Care Management Note  Care Guide Note 11/16/2023 Name: [name] [name] MRN: [id number] DOB: 06/20/1971  [name] [name] is a 53 y.o. year old male who sees Smitty Cords, DO for primary care. I reached out to Shon Hough by phone today to offer complex care management services.  [name] was given information about Complex Care Management services today including:   The Complex Care Management services include support from the care team which includes your Nurse Care Manager, Clinical Social Worker, or Pharmacist.  The Complex Care Management team is here to help remove barriers to the health concerns and goals most important to you. Complex Care Management services are voluntary, and the patient may decline or stop services at any time by request to their care team member.   Complex Care Management Consent Status: Patient wishes to consider information provided and/or speak with a member of the care team before deciding to participate in complex care management services.   Follow up plan:  Telephone appointment with complex care management team member scheduled for:  3/28/ 25 at 11:00 a.m.   Encounter Outcome:  Patient Scheduled  [facility]  [facility], [facility] Health Care Management Assistant Direct Dial: [phone]  Fax: [phone]

## 2023-11-19 DIAGNOSIS — K047 Periapical abscess without sinus: Secondary | ICD-10-CM | POA: Diagnosis not present

## 2023-12-03 DIAGNOSIS — R296 Repeated falls: Secondary | ICD-10-CM | POA: Diagnosis not present

## 2023-12-03 DIAGNOSIS — R269 Unspecified abnormalities of gait and mobility: Secondary | ICD-10-CM | POA: Diagnosis not present

## 2023-12-04 ENCOUNTER — Other Ambulatory Visit: Payer: Self-pay | Admitting: *Deleted

## 2023-12-04 NOTE — Patient Outreach (Signed)
 Care Coordination  12/04/2023  [name] [name] 12/21/1970 [id number]   Successful RNCM telephone outreach with [name]. However, he was unavailable for this telephone visit and request to reschedule. A new telephone visit was made on 12/11/23 at 2:30pm. Patient agreed to new date and time.  Estanislado Emms RN, BSN [place]  [facility] RN Care Manager [phone]

## 2023-12-11 ENCOUNTER — Other Ambulatory Visit: Payer: Self-pay | Admitting: *Deleted

## 2023-12-11 NOTE — Patient Instructions (Signed)
 Visit Information  Mr. [name] [name]  - as a part of your Medicaid benefit, you are eligible for care management and care coordination services at no cost or copay. I was unable to reach you by phone today but would be happy to help you with your health related needs. Please feel free to call me @ [phone].   A member of the Managed Medicaid care management team will reach out to you again over the next 7 days.   Estanislado Emms RN, BSN [place]  [facility] RN Care Manager [phone]

## 2023-12-11 NOTE — Patient Outreach (Signed)
  Medicaid Managed Care   Unsuccessful Attempt Note   12/11/2023 Name: [name] [name] MRN: [id number] DOB: 13-Aug-1971  Referred by: Smitty Cords, DO Reason for referral : High Risk Managed Medicaid (Unsuccessful RNCM initial telephone outreach)   An unsuccessful telephone outreach was attempted today. The patient was referred to the case management team for assistance with care management and care coordination.    Follow Up Plan: A HIPAA compliant phone message was left for the patient providing contact information and requesting a return call. and The Managed Medicaid care management team will reach out to the patient again over the next 7 days.    Estanislado Emms RN, BSN [place]  [facility] Health RN Care Manager [phone] [facility]

## 2023-12-15 ENCOUNTER — Telehealth: Payer: Self-pay

## 2023-12-15 NOTE — Telephone Encounter (Signed)
-----   Message from Nurse Shawna Orleans R sent at 12/11/2023  2:39 PM EDT ----- [name] [name], I had an unsuccessful initial telephone outreach with this patient today. Please attempt to reschedule. Thank you

## 2023-12-18 NOTE — Progress Notes (Signed)
 Complex Care Management Note Care Guide Note  12/18/2023 Name: [name] [name] MRN: [id number] DOB: 10/08/1970   Complex Care Management Outreach Attempts: An unsuccessful outreach was attempted for an appointment today.  Follow Up Plan:  No further outreach attempts will be made at this time. We have been unable to contact the patient to offer or enroll patient in complex care management services.  Encounter Outcome:  No Answer  [facility]  [facility], [facility] Health Care Management Assistant Direct Dial: [phone]  Fax: [phone]

## 2024-01-06 DIAGNOSIS — H33053 Total retinal detachment, bilateral: Secondary | ICD-10-CM | POA: Diagnosis not present

## 2024-01-07 DIAGNOSIS — H3091 Unspecified chorioretinal inflammation, right eye: Secondary | ICD-10-CM | POA: Diagnosis not present

## 2024-01-07 DIAGNOSIS — H33021 Retinal detachment with multiple breaks, right eye: Secondary | ICD-10-CM | POA: Diagnosis not present

## 2024-01-08 DIAGNOSIS — H3321 Serous retinal detachment, right eye: Secondary | ICD-10-CM | POA: Diagnosis not present

## 2024-01-08 DIAGNOSIS — H3322 Serous retinal detachment, left eye: Secondary | ICD-10-CM | POA: Diagnosis not present

## 2024-01-12 DIAGNOSIS — H3321 Serous retinal detachment, right eye: Secondary | ICD-10-CM | POA: Diagnosis not present

## 2024-01-13 ENCOUNTER — Encounter: Payer: Self-pay | Admitting: Family Medicine

## 2024-01-13 ENCOUNTER — Ambulatory Visit
Admission: RE | Admit: 2024-01-13 | Discharge: 2024-01-13 | Disposition: A | Discharge status: Home / Self Care | Source: Ambulatory Visit | Attending: Family Medicine | Admitting: Family Medicine

## 2024-01-13 ENCOUNTER — Encounter (HOSPITAL_COMMUNITY): Payer: Self-pay

## 2024-01-13 ENCOUNTER — Other Ambulatory Visit

## 2024-01-13 ENCOUNTER — Ambulatory Visit (INDEPENDENT_AMBULATORY_CARE_PROVIDER_SITE_OTHER): Admitting: Family Medicine

## 2024-01-13 ENCOUNTER — Ambulatory Visit
Admission: RE | Admit: 2024-01-13 | Discharge: 2024-01-13 | Disposition: A | Discharge status: Home / Self Care | Attending: Family Medicine | Admitting: Family Medicine

## 2024-01-13 VITALS — BP 122/80 | HR 98

## 2024-01-13 DIAGNOSIS — R2689 Other abnormalities of gait and mobility: Secondary | ICD-10-CM

## 2024-01-13 DIAGNOSIS — Z0000 Encounter for general adult medical examination without abnormal findings: Secondary | ICD-10-CM

## 2024-01-13 DIAGNOSIS — Z7984 Long term (current) use of oral hypoglycemic drugs: Secondary | ICD-10-CM | POA: Diagnosis not present

## 2024-01-13 DIAGNOSIS — M62830 Muscle spasm of back: Secondary | ICD-10-CM

## 2024-01-13 DIAGNOSIS — K219 Gastro-esophageal reflux disease without esophagitis: Secondary | ICD-10-CM

## 2024-01-13 DIAGNOSIS — E1140 Type 2 diabetes mellitus with diabetic neuropathy, unspecified: Secondary | ICD-10-CM | POA: Diagnosis not present

## 2024-01-13 DIAGNOSIS — K589 Irritable bowel syndrome without diarrhea: Secondary | ICD-10-CM | POA: Diagnosis not present

## 2024-01-13 DIAGNOSIS — E1169 Type 2 diabetes mellitus with other specified complication: Secondary | ICD-10-CM

## 2024-01-13 DIAGNOSIS — E119 Type 2 diabetes mellitus without complications: Secondary | ICD-10-CM | POA: Diagnosis not present

## 2024-01-13 DIAGNOSIS — R1084 Generalized abdominal pain: Secondary | ICD-10-CM

## 2024-01-13 DIAGNOSIS — J986 Disorders of diaphragm: Secondary | ICD-10-CM | POA: Diagnosis not present

## 2024-01-13 DIAGNOSIS — G2581 Restless legs syndrome: Secondary | ICD-10-CM

## 2024-01-13 DIAGNOSIS — Z79899 Other long term (current) drug therapy: Secondary | ICD-10-CM | POA: Diagnosis not present

## 2024-01-13 DIAGNOSIS — F3174 Bipolar disorder, in full remission, most recent episode manic: Secondary | ICD-10-CM

## 2024-01-13 DIAGNOSIS — R0602 Shortness of breath: Secondary | ICD-10-CM

## 2024-01-13 DIAGNOSIS — R110 Nausea: Secondary | ICD-10-CM

## 2024-01-13 DIAGNOSIS — R296 Repeated falls: Secondary | ICD-10-CM | POA: Diagnosis not present

## 2024-01-13 DIAGNOSIS — F319 Bipolar disorder, unspecified: Secondary | ICD-10-CM | POA: Diagnosis not present

## 2024-01-13 DIAGNOSIS — Z880 Allergy status to penicillin: Secondary | ICD-10-CM | POA: Diagnosis not present

## 2024-01-13 DIAGNOSIS — Z794 Long term (current) use of insulin: Secondary | ICD-10-CM

## 2024-01-13 DIAGNOSIS — Z885 Allergy status to narcotic agent status: Secondary | ICD-10-CM | POA: Diagnosis not present

## 2024-01-13 DIAGNOSIS — I10 Essential (primary) hypertension: Secondary | ICD-10-CM | POA: Diagnosis not present

## 2024-01-13 DIAGNOSIS — Z7982 Long term (current) use of aspirin: Secondary | ICD-10-CM | POA: Diagnosis not present

## 2024-01-13 DIAGNOSIS — R351 Nocturia: Secondary | ICD-10-CM

## 2024-01-13 DIAGNOSIS — Z91040 Latex allergy status: Secondary | ICD-10-CM | POA: Diagnosis not present

## 2024-01-13 DIAGNOSIS — R1013 Epigastric pain: Secondary | ICD-10-CM | POA: Diagnosis not present

## 2024-01-13 MED ORDER — CYCLOBENZAPRINE HCL 10 MG PO TABS: 10.0000 mg | ORAL_TABLET | Freq: Three times a day (TID) | ORAL | 0 refills | Status: DC | PRN

## 2024-01-13 MED ORDER — ONDANSETRON 4 MG PO TBDP: 4.0000 mg | ORAL_TABLET | Freq: Three times a day (TID) | ORAL | 0 refills | Status: AC | PRN

## 2024-01-13 MED ORDER — TRULICITY 0.75 MG/0.5ML ~~LOC~~ SOAJ: 0.7500 mg | SUBCUTANEOUS | 5 refills | Status: DC

## 2024-01-13 MED ORDER — PANTOPRAZOLE SODIUM 40 MG PO TBEC: 40.0000 mg | DELAYED_RELEASE_TABLET | Freq: Every day | ORAL | 1 refills | Status: DC

## 2024-01-13 NOTE — Patient Instructions (Addendum)
 Thank you for coming to the office today.  We will refer back to [facility] Neurology  Care Management / Social Work team to contact you  All meds refilled to [employer] Cyclobenzapine (Flexeril ) 10mg  tablets (muscle relaxant) - start with half (cut) to one whole pill at night for muscle relaxant - may make you sedated or sleepy (be careful driving or working on this) if tolerated you can take half to whole tab 2 to 3 times daily or every 8 hours as needed  STAT X-ray and labs today  If symptoms persistent severe worsening, then seek care at hospital ED next if not improving.   Please schedule a Follow-up Appointment to: Return if symptoms worsen or fail to improve.  If you have any other questions or concerns, please feel free to call the office or send a message through MyChart. You may also schedule an earlier appointment if necessary.  Additionally, you may be receiving a survey about your experience at our office within a few days to 1 week by e-mail or mail. We value your feedback.  Domingo Friend, DO [facility], [place]

## 2024-01-13 NOTE — Progress Notes (Signed)
 Subjective:    Patient ID: [name] [name], male    DOB: [birth year], 53 y.o.   MRN: [id number]  [name] [name] is a 53 y.o. male presenting on 01/13/2024 for Abdominal Pain, Neuropathy, and Diabetes  Patient presents for a same day appointment.  Patient was scheduled for Lab Only visit today but this was cancelled due to his symptoms instead of doing wellness physical next week.  HPI  Discussed the use of AI scribe software for clinical note transcription with the patient, who gave verbal consent to proceed.  History of Present Illness   [name] [name] "Hansel Ley" is a 53 year old male who presents with severe abdominal pain and leg tingling. He is accompanied by his mother.  He has been experiencing severe abdominal pain for a month and a half, described as a 'girdle' tightening around his waist. The pain is constant, present all day, and worsens over time, affecting his ability to eat and breathe. He has difficulty taking deep breaths and coughing fully due to the pain.  He reports tingling and pain in his legs, distinct from neuropathy. The tingling is constant, affects both legs, impacts his balance, and causes frequent falls. His legs feel weak, and he uses a cane for support.  He has not had a bowel movement in nine days, which he attributes to decreased appetite and pain. The pain significantly curbs his appetite, leading to minimal food intake.  His blood sugar levels have been high, with a recent reading of 369 mg/dL during his eye surgery. He is on insulin and gabapentin  but has not received his Trulicity prescription. Uncertain reason why he was unable to pick it up. Describes communication issue with pharmacy. He also takes pantoprazole and has recently filled his gabapentin  prescription.  He was also referred to care management team and this was not scheduled as well as he did not return call / arrange follow-up.  He has a history of recent major eye surgery for a  detached retina with an infection, requiring multiple follow-up appointments. He is on antifungal medication and various eye drops. He is legally blind and has another surgery planned for August or September.  He expresses significant anxiety due to ongoing pain and medical issues, stating that he is in 'bad shape' and seeking relief from his symptoms.           11/12/2023   10:14 AM  Depression screen PHQ 2/9  Decreased Interest 0  Down, Depressed, Hopeless 0  PHQ - 2 Score 0  Altered sleeping 3  Tired, decreased energy 2  Change in appetite 3  Feeling bad or failure about yourself  0  Trouble concentrating 1  Moving slowly or fidgety/restless 1  Suicidal thoughts 0  PHQ-9 Score 10  Difficult doing work/chores Somewhat difficult       11/12/2023   10:14 AM  GAD 7 : Generalized Anxiety Score  Nervous, Anxious, on Edge 0  Control/stop worrying 1  Worry too much - different things 1  Trouble relaxing 1  Restless 2  Easily annoyed or irritable 0  Afraid - awful might happen 0  Total GAD 7 Score 5  Anxiety Difficulty Somewhat difficult    Social History   Tobacco Use   Smoking status: Never   Smokeless tobacco: Never  Substance Use Topics   Alcohol use: No   Drug use: Not Currently    Types: "Crack" cocaine    Comment: sober since 2008    Review of  Systems Per HPI unless specifically indicated above     Objective:    BP 122/80 (BP Location: Right Arm, Patient Position: Sitting, Cuff Size: Normal)   Pulse 98   SpO2 96%   Wt Readings from Last 3 Encounters:  01/13/24 153 lb 8 oz (69.6 kg)  11/12/23 172 lb (78 kg)  12/03/17 224 lb (101.6 kg)    Physical Exam Vitals and nursing note reviewed.  Constitutional:      General: He is not in acute distress.    Appearance: He is well-developed. He is not diaphoretic.     Comments: Uncomfortable, cooperative  HENT:     Head: Normocephalic and atraumatic.  Eyes:     General:        Right eye: No discharge.         Left eye: No discharge.     Conjunctiva/sclera: Conjunctivae normal.  Neck:     Thyroid: No thyromegaly.  Cardiovascular:     Rate and Rhythm: Normal rate and regular rhythm.     Pulses: Normal pulses.     Heart sounds: Normal heart sounds. No murmur heard. Pulmonary:     Effort: No respiratory distress.     Breath sounds: Normal breath sounds. No wheezing or rales.     Comments: Reduced air movement overall but non focal. Speaks full sentences able to take deeper breaths Abdominal:     General: Bowel sounds are normal.     Palpations: Abdomen is soft. There is no mass.     Tenderness: There is abdominal tenderness (generalized pain on exam, non focal).     Hernia: No hernia is present.  Musculoskeletal:        General: Normal range of motion.     Cervical back: Normal range of motion and neck supple.     Right lower leg: No edema.     Left lower leg: No edema.     Comments: Bilateral hips and knees full range of motion. He has sense of weakness but lower extremity focal muscle testing strength is intact.  Using cane  Lymphadenopathy:     Cervical: No cervical adenopathy.  Skin:    General: Skin is warm and dry.     Findings: No erythema or rash.  Neurological:     Mental Status: He is alert and oriented to person, place, and time. Mental status is at baseline.     Comments: Intact sensation to light touch lower extremities bilateral  Psychiatric:        Behavior: Behavior normal.     Comments: Well groomed, good eye contact, normal speech and thoughts     I have personally reviewed the radiology report from 01/13/24 on CXR STAT.  CLINICAL DATA:  Shortness of breath   EXAM: CHEST - 2 VIEW   COMPARISON:  July 09, 2006   FINDINGS: The heart size and mediastinal contours are within normal limits. Both lungs are clear. The visualized skeletal structures are unremarkable.   Old healed fracture of the left mid clavicle   IMPRESSION: No active cardiopulmonary  disease.     Electronically Signed   By: Fredrich Jefferson M.D.   On: 01/13/2024 11:07  Results for orders placed or performed in visit on 01/13/24  Comprehensive metabolic panel with GFR   Collection Time: 01/13/24  9:39 AM  Result Value Ref Range   Glucose, Bld 394 (H) 65 - 99 mg/dL   BUN 12 7 - 25 mg/dL   Creat 8.11 (L) 9.14 -  1.30 mg/dL   eGFR 098 > OR = 60 JX/BJY/7.82N5   BUN/Creatinine Ratio 20 6 - 22 (calc)   Sodium 132 (L) 135 - 146 mmol/L   Potassium 4.6 3.5 - 5.3 mmol/L   Chloride 98 98 - 110 mmol/L   CO2 25 20 - 32 mmol/L   Calcium 9.5 8.6 - 10.3 mg/dL   Total Protein 6.9 6.1 - 8.1 g/dL   Albumin 4.2 3.6 - 5.1 g/dL   Globulin 2.7 1.9 - 3.7 g/dL (calc)   AG Ratio 1.6 1.0 - 2.5 (calc)   Total Bilirubin 0.4 0.2 - 1.2 mg/dL   Alkaline phosphatase (APISO) 103 35 - 144 U/L   AST 7 (L) 10 - 35 U/L   ALT 6 (L) 9 - 46 U/L  CBC with Differential/Platelet   Collection Time: 01/13/24  9:39 AM  Result Value Ref Range   WBC 7.2 3.8 - 10.8 Thousand/uL   RBC 5.52 4.20 - 5.80 Million/uL   Hemoglobin 15.9 13.2 - 17.1 g/dL   HCT 62.1 30.8 - 65.7 %   MCV 85.1 80.0 - 100.0 fL   MCH 28.8 27.0 - 33.0 pg   MCHC 33.8 32.0 - 36.0 g/dL   RDW 84.6 96.2 - 95.2 %   Platelets 380 140 - 400 Thousand/uL   MPV 9.7 7.5 - 12.5 fL   Neutro Abs 4,442 1,500 - 7,800 cells/uL   Absolute Lymphocytes 2,117 850 - 3,900 cells/uL   Absolute Monocytes 490 200 - 950 cells/uL   Eosinophils Absolute 122 15 - 500 cells/uL   Basophils Absolute 29 0 - 200 cells/uL   Neutrophils Relative % 61.7 %   Total Lymphocyte 29.4 %   Monocytes Relative 6.8 %   Eosinophils Relative 1.7 %   Basophils Relative 0.4 %  Lipase   Collection Time: 01/13/24  9:39 AM  Result Value Ref Range   Lipase 100 (H) 7 - 60 U/L      Assessment & Plan:   Problem List Items Addressed This Visit     GERD   Relevant Medications   pantoprazole (PROTONIX) 40 MG tablet   ondansetron  (ZOFRAN -ODT) 4 MG disintegrating tablet   Type 2  diabetes mellitus with diabetic neuropathy, with long-term current use of insulin (HCC) - Primary   Relevant Medications   TRULICITY 0.75 MG/0.5ML SOAJ   Other Relevant Orders   Comprehensive metabolic panel with GFR (Completed)   Hemoglobin A1c   CBC with Differential/Platelet (Completed)   Other Visit Diagnoses       Balance disorder         Restless leg syndrome         Recurrent falls         Diaphragm dysfunction       Relevant Orders   DG Chest 2 View (Completed)     Shortness of breath       Relevant Orders   DG Chest 2 View (Completed)     Generalized abdominal pain       Relevant Orders   Comprehensive metabolic panel with GFR (Completed)   CBC with Differential/Platelet (Completed)   Lipase (Completed)     Nausea       Relevant Medications   ondansetron  (ZOFRAN -ODT) 4 MG disintegrating tablet     Muscle spasm of back       Relevant Medications   cyclobenzaprine  (FLEXERIL ) 10 MG tablet        Abdominal pain with muscle spasms Severe abdominal pain with muscle spasms, worsening over  1.5 months. Differential includes nerve or muscle injury, possibly spine-related. Discussed risks of respiratory depression with pain medication.  - Order x-ray for chest to evaluate if any unusual position of findings of lungs and or diaphragm  - Prescribe Flexeril  for muscle spasms. - Prescribe pantoprazole for GERD, he needs to restart this, it was ordered last time but not picked up. - Prescribe Zofran  for nausea.  STAT Labs ordered today CBC CMET Lipase  Check A1c  We cancelled his routine physical preventative labs that were supposed to be today given acuity of his needs.  He is able to take reasonably deep breaths and no other focal respiratory symptoms. His vitals are hemodynamically stable, oxygen is good. I do not see any acute etiology right now to explain. I think he has neuropathy related to back and also diabetes affecting his nerves, but we will need more work up and  management.  I advised that I do not feel comfortable ordering opioid pain medicine today due to risk of respiratory depression.  Given severity of all of his symptoms and likely severely uncontrolled type 2 diabetes prior A1c >14 and did not follow through with the medication treatment as we prescribed last visit. I advised that I am not comfortable with his current constellation of symptoms and advised that he may need to be urgently evaluated at hospital ED if not improving soon within later today or within 48 hours  - Advise hospital visit if symptoms persist or worsen.  Bilateral upper thigh Leg pain and tingling paresthesia Bilateral leg pain and tingling affecting balance, possibly nerve or muscle injury, spine-related. Discussed potential need for hospital evaluation if symptoms persist.  Last visit he was referred to Neurologist back in March 2025, he no showed the apt and was not seen. He has issues with his eyesight and did not receive the calls. I will re-connect him with our referral coordinator to re-schedule this prior referral. 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 Legally blind, requires further surgery. Ongoing follow-up with ophthalmologist necessary. - Continue current eye medications as prescribed by ophthalmologist. - Follow up with eye surgeon as scheduled.  Diabetes mellitus with poor control Poorly controlled diabetes with A1c >14. Blood sugar 369 during recent surgery. Not on Trulicity due to pharmacy issues with rx, did not follow-up with this despite our attempt to order it last visit. - Resend Trulicity prescription to [facility]. - Ensure insulin supply is maintained. - Order lab panel including blood sugar levels.     Additionally - I have attempted to re-connect with our managed medicaid / [facility] care management team to reconnect with him. He has had many barriers with missed appointments,  missed calls, difficulty picking up meds from pharmacy, failed referrals. I am going to need assistance to manage his care going forward so we can address barriers to care.  *Patient was contacted today 5/7 by our clinical staff and reviewed results, advised to seek care at hospital ED due to elevated Lipase and his various concerns. He advised that he preferred to go to [facility] and he verbally agreed to go to ED*  Orders Placed This Encounter  Procedures   DG Chest 2 View    Standing Status:   Future    Number of Occurrences:   1    Expiration Date:   01/12/2025    Reason for Exam (SYMPTOM  OR DIAGNOSIS REQUIRED):   shortness of breath 1 month, unable to  take deep breath    Preferred imaging location?:   [facility]   Comprehensive metabolic panel with GFR   Hemoglobin A1c   CBC with Differential/Platelet   Lipase    Meds ordered this encounter  Medications   pantoprazole (PROTONIX) 40 MG tablet    Sig: Take 1 tablet (40 mg total) by mouth daily before breakfast.    Dispense:  90 tablet    Refill:  1   TRULICITY 0.75 MG/0.5ML SOAJ    Sig: Inject 0.75 mg into the skin once a week.    Dispense:  2 mL    Refill:  5   ondansetron  (ZOFRAN -ODT) 4 MG disintegrating tablet    Sig: Take 1 tablet (4 mg total) by mouth every 8 (eight) hours as needed for nausea or vomiting.    Dispense:  30 tablet    Refill:  0   cyclobenzaprine  (FLEXERIL ) 10 MG tablet    Sig: Take 1 tablet (10 mg total) by mouth 3 (three) times daily as needed for muscle spasms.    Dispense:  30 tablet    Refill:  0    Follow up plan: Return if symptoms worsen or fail to improve.  Domingo Friend, DO [facility] 01/13/2024, 8:43 AM

## 2024-01-14 ENCOUNTER — Telehealth: Payer: Self-pay

## 2024-01-14 DIAGNOSIS — E1140 Type 2 diabetes mellitus with diabetic neuropathy, unspecified: Secondary | ICD-10-CM

## 2024-01-14 DIAGNOSIS — R1013 Epigastric pain: Secondary | ICD-10-CM | POA: Diagnosis not present

## 2024-01-14 LAB — COMPREHENSIVE METABOLIC PANEL WITH GFR
AG Ratio: 1.6 (calc) (ref 1.0–2.5)
ALT: 6 U/L — ABNORMAL LOW (ref 9–46)
AST: 7 U/L — ABNORMAL LOW (ref 10–35)
Albumin: 4.2 g/dL (ref 3.6–5.1)
Alkaline phosphatase (APISO): 103 U/L (ref 35–144)
BUN/Creatinine Ratio: 20 (calc) (ref 6–22)
BUN: 12 mg/dL (ref 7–25)
CO2: 25 mmol/L (ref 20–32)
Calcium: 9.5 mg/dL (ref 8.6–10.3)
Chloride: 98 mmol/L (ref 98–110)
Creat: 0.6 mg/dL — ABNORMAL LOW (ref 0.70–1.30)
Globulin: 2.7 g/dL (ref 1.9–3.7)
Glucose, Bld: 394 mg/dL — ABNORMAL HIGH (ref 65–99)
Potassium: 4.6 mmol/L (ref 3.5–5.3)
Sodium: 132 mmol/L — ABNORMAL LOW (ref 135–146)
Total Bilirubin: 0.4 mg/dL (ref 0.2–1.2)
Total Protein: 6.9 g/dL (ref 6.1–8.1)
eGFR: 117 mL/min/{1.73_m2} (ref >=60)

## 2024-01-14 LAB — CBC WITH DIFFERENTIAL/PLATELET
Absolute Lymphocytes: 2117 {cells}/uL (ref 850–3900)
Absolute Monocytes: 490 {cells}/uL (ref 200–950)
Basophils Absolute: 29 {cells}/uL (ref 0–200)
Basophils Relative: 0.4 %
Eosinophils Absolute: 122 {cells}/uL (ref 15–500)
Eosinophils Relative: 1.7 %
HCT: 47 % (ref 38.5–50.0)
Hemoglobin: 15.9 g/dL (ref 13.2–17.1)
MCH: 28.8 pg (ref 27.0–33.0)
MCHC: 33.8 g/dL (ref 32.0–36.0)
MCV: 85.1 fL (ref 80.0–100.0)
MPV: 9.7 fL (ref 7.5–12.5)
Monocytes Relative: 6.8 %
Neutro Abs: 4442 {cells}/uL (ref 1500–7800)
Neutrophils Relative %: 61.7 %
Platelets: 380 Thousand/uL (ref 140–400)
RBC: 5.52 Million/uL (ref 4.20–5.80)
RDW: 13.2 % (ref 11.0–15.0)
Total Lymphocyte: 29.4 %
WBC: 7.2 Thousand/uL (ref 3.8–10.8)

## 2024-01-14 LAB — HEMOGLOBIN A1C

## 2024-01-14 LAB — LIPASE: Lipase: 100 U/L — ABNORMAL HIGH (ref 7–60)

## 2024-01-14 MED ORDER — METFORMIN HCL 1000 MG PO TABS: 1000.0000 mg | ORAL_TABLET | Freq: Two times a day (BID) | ORAL | 1 refills | Status: AC

## 2024-01-14 NOTE — Telephone Encounter (Signed)
 Spoke with patient, notified him of the denial and to take 1000 mg of metformin twice a day. Verbal understanding

## 2024-01-14 NOTE — Telephone Encounter (Signed)
 Denial for Trulicity due to patient not tried/failed Metformin, however I see on his chart since 2019 he was on Metformin 1000mg  TWICE A DAY. However he is likely out of this medicine now and has no recent order.  I have placed new order for Metformin 1000mg  TWICE A DAY to his pharmacy. He should pick it up and then upcoming we will be able to re-attempt to order Trulicity or Ozempic within 30 days or less, once he fills the Metformin once at pharmacy.  Domingo Friend, DO [facility] 01/14/2024, 2:47 PM

## 2024-01-14 NOTE — Telephone Encounter (Addendum)
 [name] [name] ([place]: [place]) PA Case ID #: [id number] Rx #: [id number] Need Help? Call us  at [phone] Status sent iconSent to Plan today Drug Trulicity 0.75MG /0.5ML auto-injectors ePA cloud logo Form [facility] Medicaid of [place]  Electronic Prior Authorization Request Form (2017 NCPDP) Original Claim Info 75 PA REQUIRED. MUST T/F METFORMIN THERAPYOR ENTER ICD 10 AT POSSubmit 3 DS For Emerg Fill with PA Type01, PA Number 1111, Level of Service 3   Denied information scanned in

## 2024-01-14 NOTE — Telephone Encounter (Signed)
 Trulicity denied, information scanned in

## 2024-01-14 NOTE — Addendum Note (Signed)
 Addended by: Raina Bunting on: 01/14/2024 02:51 PM   Modules accepted: Orders

## 2024-01-18 DIAGNOSIS — H3321 Serous retinal detachment, right eye: Secondary | ICD-10-CM | POA: Diagnosis not present

## 2024-01-20 ENCOUNTER — Encounter: Admitting: Family Medicine

## 2024-01-22 DIAGNOSIS — F410 Panic disorder [episodic paroxysmal anxiety] without agoraphobia: Secondary | ICD-10-CM | POA: Diagnosis not present

## 2024-01-22 DIAGNOSIS — F902 Attention-deficit hyperactivity disorder, combined type: Secondary | ICD-10-CM | POA: Diagnosis not present

## 2024-01-22 DIAGNOSIS — F3181 Bipolar II disorder: Secondary | ICD-10-CM | POA: Diagnosis not present

## 2024-01-25 ENCOUNTER — Telehealth: Payer: Self-pay

## 2024-01-29 DIAGNOSIS — H3321 Serous retinal detachment, right eye: Secondary | ICD-10-CM | POA: Diagnosis not present

## 2024-02-03 ENCOUNTER — Telehealth: Payer: Self-pay

## 2024-02-03 NOTE — Progress Notes (Signed)
 Complex Care Management Care Guide Note  02/03/2024 Name: [name] [name] MRN: [id number] DOB: 19-Nov-1970  KIELAN DREISBACH is a 53 y.o. year old male who is a primary care patient of Raina Bunting, DO and is actively engaged with the care management team. I reached out to Loretta Romp by phone today to assist with re-scheduling  with the RN Case Manager.  Follow up plan: Unsuccessful telephone outreach attempt made. A HIPAA compliant phone message was left for the patient providing contact information and requesting a return call.  Lenton Rail , RMA     [facility]  [facility], [facility] Guide  Direct Dial: [phone]  Website: [email or web address]

## 2024-02-05 ENCOUNTER — Other Ambulatory Visit

## 2024-02-09 DIAGNOSIS — H3321 Serous retinal detachment, right eye: Secondary | ICD-10-CM | POA: Diagnosis not present

## 2024-02-09 NOTE — Progress Notes (Signed)
 Complex Care Management Care Guide Note  02/09/2024 Name: [name] [name] MRN: [id number] DOB: December 14, 1970  [name] [name] is a 53 y.o. year old male who is a primary care patient of Raina Bunting, DO and is actively engaged with the care management team. I reached out to Loretta Romp by phone today to assist with re-scheduling  with the RN Case Manager.  Follow up plan: No longer a patient at this office   Lenton Rail , RMA     [place]  [facility], [facility] Guide  Direct Dial: [phone]  Website: [email or web address]

## 2024-02-29 ENCOUNTER — Encounter: Payer: Self-pay | Admitting: Internal Medicine

## 2024-02-29 ENCOUNTER — Ambulatory Visit: Admitting: Internal Medicine

## 2024-02-29 VITALS — BP 122/78 | HR 90 | Temp 97.8°F | Resp 16 | Ht 71.0 in | Wt 155.0 lb

## 2024-02-29 DIAGNOSIS — F3174 Bipolar disorder, in full remission, most recent episode manic: Secondary | ICD-10-CM | POA: Diagnosis not present

## 2024-02-29 DIAGNOSIS — F902 Attention-deficit hyperactivity disorder, combined type: Secondary | ICD-10-CM | POA: Diagnosis not present

## 2024-02-29 DIAGNOSIS — E1169 Type 2 diabetes mellitus with other specified complication: Secondary | ICD-10-CM

## 2024-02-29 DIAGNOSIS — R2681 Unsteadiness on feet: Secondary | ICD-10-CM

## 2024-02-29 DIAGNOSIS — R29898 Other symptoms and signs involving the musculoskeletal system: Secondary | ICD-10-CM

## 2024-02-29 DIAGNOSIS — E1140 Type 2 diabetes mellitus with diabetic neuropathy, unspecified: Secondary | ICD-10-CM

## 2024-02-29 DIAGNOSIS — Z794 Long term (current) use of insulin: Secondary | ICD-10-CM

## 2024-02-29 DIAGNOSIS — E785 Hyperlipidemia, unspecified: Secondary | ICD-10-CM

## 2024-02-29 DIAGNOSIS — K219 Gastro-esophageal reflux disease without esophagitis: Secondary | ICD-10-CM | POA: Diagnosis not present

## 2024-02-29 NOTE — Progress Notes (Signed)
 Subjective:    Patient ID: [name] [name], male    DOB: 12/30/1970, 53 y.o.   MRN: [id number]  Patient here for  Chief Complaint  Patient presents with   Establish Care    HPI Here to establish care. He is accompanied by his mother. Seeing ophthalmology for right retinal detachment. Last seen 02/22/24. Evaluated 01/13/24 - ER - epigastric pain. Per note, symptoms imporved with GI cocktail. Started on PPI and carafate. Had been evaluated 01/13/24 - abdominal pain and leg tingling - by his pcp. Describes the abdominal discomfort (which has been present for several months). He describes the sensation as a tightening from chest to upper thighs. This affects his appetite. Decreased appetite. States he has lost 70 pounds since 2022. This also affects his cough - unable to fully cough. Trying to drink ensure. Has diabetes. Has been on insulin  and metformin . Has not been controlled. Was hospitalized in 08/2023 - DKA. States has neuropathy - bilateral legs. Has been taking gabapentin . Legs are weak. Unable to go up and down stairs. Is being followed by ophthalmology - right eye retinal detachment. Has had surgery. Planning f/u with ophthalmology. Vision affected. Has been seeing Dr Marlo - psychiatry - diagnosed with bipolar mania and ADHD. Takes depakote. Has xanax to take prn. He denies alcohol intake. Smokes - hemp.    Past Medical History:  Diagnosis Date   ADHD    Anxiety    Bipolar 1 disorder (HCC)    Diabetes mellitus without complication (HCC)    GERD (gastroesophageal reflux disease)    Hyperlipidemia    Hypertension    no meds   Inflammatory bowel disease    Injury of UCL of right wrist    PUD (peptic ulcer disease) 2007   Mild   Past Surgical History:  Procedure Laterality Date   ESOPHAGOGASTRODUODENOSCOPY  07/03/2006   Normal esophagus without evidence of Barrett's/ Normal duodenum/ Erythema and edema in the antrum /  1. Erythema, edema, and hemorrhage in the cardia likely  secondary to   vomiting and retching   ESOPHAGOGASTRODUODENOSCOPY  07/10/2006   Normal esophagus with patchy areas of fundal erythema and superficial erosion./ Two small prepyloric ulcers all of which had a benign appearance status post biopsy; patent pylorus   EYE SURGERY     HAND HARDWARE REMOVAL Right 05/26/2013   INCISION AND DRAINAGE ABSCESS Right 05/26/2013   hand   OPEN REDUCTION INTERNAL FIXATION (ORIF) METACARPAL Right 04/28/2013   5th metacarpal   ULNA OSTEOTOMY Right 12/03/2017   Procedure: ULNAR SHORTENING;  Surgeon: Murrell Kuba, MD;  Location: [facility];  Service: Orthopedics;  Laterality: Right;   WRIST ARTHROSCOPY WITH DEBRIDEMENT Right 01/15/2017   Procedure: RIGHT WRIST ARTHROSCOPY WITH DEBRIDEMENT AND SHRINKAGE LUNOTRIQUETRAL TEAR;  Surgeon: Murrell Kuba, MD;  Location: [facility];  Service: Orthopedics;  Laterality: Right;  AXILLARY BLOCK   WRIST OSTEOTOMY Right 12/03/2017   Procedure: RIGHT WRIST OSTEOTOMY;  Surgeon: Murrell Kuba, MD;  Location: [facility];  Service: Orthopedics;  Laterality: Right;   Family History  Problem Relation Age of Onset   Diabetes Mother    Depression Mother    Thyroid  disease Mother    Heart disease Father    Stroke Father    Kidney disease Maternal Grandmother    Hypertension Maternal Grandmother    Hyperlipidemia Maternal Grandmother    Heart disease Maternal Grandmother    Diabetes Maternal Grandmother    Kidney disease Maternal Grandfather  Hypertension Maternal Grandfather    Hyperlipidemia Maternal Grandfather    Heart disease Maternal Grandfather    Diabetes Maternal Grandfather    Social History   Socioeconomic History   Marital status: Divorced    Spouse name: Not on file   Number of children: Not on file   Years of education: Not on file   Highest education level: Not on file  Occupational History   Not on file  Tobacco Use   Smoking status: Never   Smokeless tobacco:  Never  Substance and Sexual Activity   Alcohol use: No   Drug use: Not Currently    Types: Crack cocaine    Comment: sober since 2008   Sexual activity: Not on file  Other Topics Concern   Not on file  Social History Narrative   Not on file   Social Drivers of Health   Financial Resource Strain: Not on file  Food Insecurity: Not on file  Transportation Needs: Not on file  Physical Activity: Not on file  Stress: Not on file  Social Connections: Not on file     Review of Systems  Constitutional:  Positive for appetite change and fatigue.       Weight loss as outlined.   HENT:  Negative for congestion and sinus pressure.   Respiratory:         Cough affected by the sensation - squeezing abdomen - as outlined. No increased sob.   Cardiovascular:  Negative for chest pain, palpitations and leg swelling.  Gastrointestinal:        Decreased appetite. Weight loss. No vomiting.   Genitourinary:  Negative for difficulty urinating and dysuria.  Musculoskeletal:  Negative for joint swelling.       Leg weakness as outlned.   Skin:  Negative for color change and rash.  Neurological:  Negative for dizziness and headaches.  Psychiatric/Behavioral:         History bipolar and ADHD. Reports stable on current regimen.        Objective:     BP 122/78   Pulse 90   Temp 97.8 F (36.6 C)   Resp 16   Ht 5' 11 (1.803 m)   Wt 155 lb (70.3 kg)   SpO2 99%   BMI 21.62 kg/m  Wt Readings from Last 3 Encounters:  02/29/24 155 lb (70.3 kg)  01/13/24 153 lb 8 oz (69.6 kg)  11/12/23 172 lb (78 kg)    Physical Exam Vitals reviewed.  Constitutional:      General: He is not in acute distress.    Appearance: Normal appearance. He is well-developed.  HENT:     Head: Normocephalic and atraumatic.     Right Ear: External ear normal.     Left Ear: External ear normal.     Mouth/Throat:     Pharynx: No oropharyngeal exudate or posterior oropharyngeal erythema.   Eyes:     General: No  scleral icterus.       Right eye: No discharge.        Left eye: No discharge.     Conjunctiva/sclera: Conjunctivae normal.    Cardiovascular:     Rate and Rhythm: Normal rate and regular rhythm.  Pulmonary:     Effort: Pulmonary effort is normal. No respiratory distress.     Breath sounds: Normal breath sounds.  Abdominal:     General: Bowel sounds are normal.     Palpations: Abdomen is soft.     Tenderness: There is no abdominal  tenderness.   Musculoskeletal:        General: No swelling.     Cervical back: Neck supple. No tenderness.     Comments: Decreased motorstrength with dorsi and plantar flexion - left foot. Unsteady gait.   Lymphadenopathy:     Cervical: No cervical adenopathy.   Skin:    Findings: No erythema or rash.   Neurological:     Mental Status: He is alert.   Psychiatric:        Mood and Affect: Mood normal.        Behavior: Behavior normal.         Outpatient Encounter Medications as of 02/29/2024  Medication Sig   ACCU-CHEK AVIVA PLUS test strip Use to check blood sugar up to twice a day as advised   Accu-Chek Softclix Lancets lancets Use to check blood sugar up to twice a day as advised   alprazolam (XANAX) 2 MG tablet Take 2 mg by mouth at bedtime as needed for sleep.   amphetamine-dextroamphetamine (ADDERALL) 30 MG tablet Take 100 mg by mouth 2 (two) times daily.  (Patient taking differently: Take 60 mg by mouth 2 (two) times daily.)   Blood Glucose Monitoring Suppl (ACCU-CHEK AVIVA PLUS) w/Device KIT Use to check blood sugar twice a day as advised   cyclobenzaprine  (FLEXERIL ) 10 MG tablet Take 1 tablet (10 mg total) by mouth 3 (three) times daily as needed for muscle spasms.   diclofenac  (VOLTAREN ) 75 MG EC tablet Take 75 mg by mouth 2 (two) times daily.   divalproex (DEPAKOTE) 500 MG 24 hr tablet Take 1,500 mg by mouth daily.    finasteride (PROSCAR) 5 MG tablet Take 5 mg by mouth daily.   gabapentin  (NEURONTIN ) 600 MG tablet Take 1 tablet (600  mg total) by mouth 3 (three) times daily.   LEVEMIR  FLEXPEN 100 UNIT/ML FlexPen Inject 50 Units into the skin 2 (two) times daily. (Patient taking differently: Inject 50 Units into the skin daily.)   metFORMIN  (GLUCOPHAGE ) 1000 MG tablet Take 1 tablet (1,000 mg total) by mouth 2 (two) times daily with a meal.   ondansetron  (ZOFRAN -ODT) 4 MG disintegrating tablet Take 1 tablet (4 mg total) by mouth every 8 (eight) hours as needed for nausea or vomiting.   pantoprazole  (PROTONIX ) 40 MG tablet Take 1 tablet (40 mg total) by mouth daily before breakfast.   tamsulosin (FLOMAX) 0.4 MG CAPS capsule Take 0.4 mg by mouth. (Patient not taking: Reported on 02/29/2024)   [DISCONTINUED] allopurinol (ZYLOPRIM) 300 MG tablet Take 300 mg by mouth daily. (Patient not taking: Reported on 01/13/2024)   [DISCONTINUED] amphetamine-dextroamphetamine (ADDERALL XR) 20 MG 24 hr capsule Take 20 mg by mouth daily.   [DISCONTINUED] TRULICITY  0.75 MG/0.5ML SOAJ Inject 0.75 mg into the skin once a week.   No facility-administered encounter medications on file as of 02/29/2024.     Lab Results  Component Value Date   WBC 7.2 01/13/2024   HGB 15.9 01/13/2024   HCT 47.0 01/13/2024   PLT 380 01/13/2024   GLUCOSE 394 (H) 01/13/2024   ALT 6 (L) 01/13/2024   AST 7 (L) 01/13/2024   NA 132 (L) 01/13/2024   K 4.6 01/13/2024   CL 98 01/13/2024   CREATININE 0.60 (L) 01/13/2024   BUN 12 01/13/2024   CO2 25 01/13/2024   HGBA1C CANCELED 01/13/2024    DG Chest 2 View Result Date: 01/13/2024 CLINICAL DATA:  Shortness of breath EXAM: CHEST - 2 VIEW COMPARISON:  July 09, 2006 FINDINGS:  The heart size and mediastinal contours are within normal limits. Both lungs are clear. The visualized skeletal structures are unremarkable. Old healed fracture of the left mid clavicle IMPRESSION: No active cardiopulmonary disease. Electronically Signed   By: Franky Chard M.D.   On: 01/13/2024 11:07       Assessment & Plan:  Unsteady gait -      For home use only DME 4 wheeled rolling walker with seat  Bipolar disorder, in full remission, most recent episode manic [facility]) Assessment & Plan: Has been followed by Dr Marlo. Continues on depakote. Has xanax to take prn. Reports rarely takes.    Attention deficit hyperactivity disorder (ADHD), combined type Assessment & Plan: Seeing Dr Marlo. Continues on adderall.    Gastroesophageal reflux disease, unspecified whether esophagitis present Assessment & Plan: Continue protonix . Weight loss as outlined. Decreased appetite. Check labs - including cbc and metabolic panel. Reports previous EGD x 2. The squeezing sensation limits appetite. Seeing neurology tomorrow.    Hyperlipidemia associated with type 2 diabetes mellitus (HCC) Assessment & Plan: Check lipid panel with next labs.    Type 2 diabetes mellitus with diabetic neuropathy, with long-term current use of insulin  [facility]) Assessment & Plan: Documented with type II diabetes. Appears sugars have been poorly controlled. Do not see recent A1c. He does not check sugars. Discussed. Taking insulin . Reports also taking tradjenta and metformin . Has lost a significant amount of weight due to decreased appetite. Check met b and A1c.    Leg weakness, bilateral Assessment & Plan: Leg weakness which limits mobility. Unable to go up stairs. Unsteady gait. Exam with left foot wekness as outlined. Describes the tingling sensation as outlined. Pain - squeezing sensation as outlined. Has appt with neurology tomorrow. Discussed further w/up , including NCS/EMG. Await neurology recommendations.     I spent 45 minutes with the patient .  Time spent discussing her current concerns and symptoms.  Specifically time spent discussing his current leg weakness, decreased appetite and squeezing sensation. Time also spent discussing further w/up, evaluation and treatment.    Allena Hamilton, MD

## 2024-03-05 ENCOUNTER — Encounter: Payer: Self-pay | Admitting: Internal Medicine

## 2024-03-05 DIAGNOSIS — R29898 Other symptoms and signs involving the musculoskeletal system: Secondary | ICD-10-CM | POA: Insufficient documentation

## 2024-03-05 NOTE — Assessment & Plan Note (Signed)
 Has been followed by Dr Marlo. Continues on depakote. Has xanax to take prn. Reports rarely takes.

## 2024-03-05 NOTE — Assessment & Plan Note (Signed)
 -  Check lipid panel with next labs

## 2024-03-05 NOTE — Assessment & Plan Note (Signed)
 Seeing Dr Marlo. Continues on adderall.

## 2024-03-05 NOTE — Assessment & Plan Note (Signed)
 Leg weakness which limits mobility. Unable to go up stairs. Unsteady gait. Exam with left foot wekness as outlined. Describes the tingling sensation as outlined. Pain - squeezing sensation as outlined. Has appt with neurology tomorrow. Discussed further w/up , including NCS/EMG. Await neurology recommendations.

## 2024-03-05 NOTE — Assessment & Plan Note (Signed)
 Documented with type II diabetes. Appears sugars have been poorly controlled. Do not see recent A1c. He does not check sugars. Discussed. Taking insulin . Reports also taking tradjenta and metformin . Has lost a significant amount of weight due to decreased appetite. Check met b and A1c.

## 2024-03-05 NOTE — Assessment & Plan Note (Signed)
 Continue protonix . Weight loss as outlined. Decreased appetite. Check labs - including cbc and metabolic panel. Reports previous EGD x 2. The squeezing sensation limits appetite. Seeing neurology tomorrow.

## 2024-03-07 ENCOUNTER — Other Ambulatory Visit: Payer: Self-pay

## 2024-03-07 ENCOUNTER — Other Ambulatory Visit (INDEPENDENT_AMBULATORY_CARE_PROVIDER_SITE_OTHER)

## 2024-03-07 ENCOUNTER — Other Ambulatory Visit

## 2024-03-07 DIAGNOSIS — Z125 Encounter for screening for malignant neoplasm of prostate: Secondary | ICD-10-CM

## 2024-03-07 DIAGNOSIS — E1140 Type 2 diabetes mellitus with diabetic neuropathy, unspecified: Secondary | ICD-10-CM | POA: Diagnosis not present

## 2024-03-07 DIAGNOSIS — Z794 Long term (current) use of insulin: Secondary | ICD-10-CM | POA: Diagnosis not present

## 2024-03-07 DIAGNOSIS — E1169 Type 2 diabetes mellitus with other specified complication: Secondary | ICD-10-CM

## 2024-03-07 DIAGNOSIS — E785 Hyperlipidemia, unspecified: Secondary | ICD-10-CM

## 2024-03-07 LAB — PSA: PSA: 1.25 ng/mL (ref 0.10–4.00)

## 2024-03-07 LAB — LIPID PANEL
Cholesterol: 230 mg/dL — ABNORMAL HIGH (ref 0–200)
HDL: 38.4 mg/dL — ABNORMAL LOW (ref >39.00)
NonHDL: 191.86
Total CHOL/HDL Ratio: 6
Triglycerides: 677 mg/dL — ABNORMAL HIGH (ref 0.0–149.0)
VLDL: 135.4 mg/dL — ABNORMAL HIGH (ref 0.0–40.0)

## 2024-03-07 LAB — LDL CHOLESTEROL, DIRECT: Direct LDL: 88 mg/dL

## 2024-03-07 LAB — BASIC METABOLIC PANEL WITH GFR
BUN: 13 mg/dL (ref 6–23)
CO2: 26 meq/L (ref 19–32)
Calcium: 9.7 mg/dL (ref 8.4–10.5)
Chloride: 94 meq/L — ABNORMAL LOW (ref 96–112)
Creatinine, Ser: 0.68 mg/dL (ref 0.40–1.50)
GFR: 107.72 mL/min (ref >60.00)
Glucose, Bld: 464 mg/dL — ABNORMAL HIGH (ref 70–99)
Potassium: 4.2 meq/L (ref 3.5–5.1)
Sodium: 132 meq/L — ABNORMAL LOW (ref 135–145)

## 2024-03-07 LAB — HEPATIC FUNCTION PANEL
ALT: 23 U/L (ref 0–53)
AST: 20 U/L (ref 0–37)
Albumin: 4.4 g/dL (ref 3.5–5.2)
Alkaline Phosphatase: 138 U/L — ABNORMAL HIGH (ref 39–117)
Bilirubin, Direct: 0.1 mg/dL (ref 0.0–0.3)
Total Bilirubin: 0.5 mg/dL (ref 0.2–1.2)
Total Protein: 7 g/dL (ref 6.0–8.3)

## 2024-03-07 LAB — MICROALBUMIN / CREATININE URINE RATIO
Creatinine,U: 34.2 mg/dL
Microalb Creat Ratio: 22.8 mg/g (ref 0.0–30.0)
Microalb, Ur: 0.8 mg/dL (ref 0.0–1.9)

## 2024-03-07 LAB — TSH: TSH: 2.01 u[IU]/mL (ref 0.35–5.50)

## 2024-03-08 ENCOUNTER — Ambulatory Visit: Payer: Self-pay | Admitting: Internal Medicine

## 2024-03-08 LAB — HEMOGLOBIN A1C
Est. average glucose Bld gHb Est-mCnc: 367 mg/dL
Hgb A1c MFr Bld: 14.4 % — ABNORMAL HIGH (ref 4.8–5.6)

## 2024-03-08 MED ORDER — ROSUVASTATIN CALCIUM 10 MG PO TABS: 10.0000 mg | ORAL_TABLET | Freq: Every day | ORAL | 0 refills | Status: DC

## 2024-03-10 ENCOUNTER — Ambulatory Visit: Payer: Self-pay | Admitting: Internal Medicine

## 2024-05-03 ENCOUNTER — Ambulatory Visit: Admitting: Dietician

## 2024-05-10 ENCOUNTER — Ambulatory Visit: Payer: MEDICAID | Admitting: Internal Medicine

## 2024-05-10 VITALS — BP 136/74 | HR 90 | Ht 71.0 in | Wt 169.6 lb

## 2024-05-10 DIAGNOSIS — Z794 Long term (current) use of insulin: Secondary | ICD-10-CM | POA: Diagnosis not present

## 2024-05-10 DIAGNOSIS — R1010 Upper abdominal pain, unspecified: Secondary | ICD-10-CM

## 2024-05-10 DIAGNOSIS — E785 Hyperlipidemia, unspecified: Secondary | ICD-10-CM

## 2024-05-10 DIAGNOSIS — M25562 Pain in left knee: Secondary | ICD-10-CM

## 2024-05-10 DIAGNOSIS — E1169 Type 2 diabetes mellitus with other specified complication: Secondary | ICD-10-CM | POA: Diagnosis not present

## 2024-05-10 DIAGNOSIS — F902 Attention-deficit hyperactivity disorder, combined type: Secondary | ICD-10-CM

## 2024-05-10 DIAGNOSIS — F3174 Bipolar disorder, in full remission, most recent episode manic: Secondary | ICD-10-CM

## 2024-05-10 DIAGNOSIS — E1140 Type 2 diabetes mellitus with diabetic neuropathy, unspecified: Secondary | ICD-10-CM | POA: Diagnosis not present

## 2024-05-10 DIAGNOSIS — K219 Gastro-esophageal reflux disease without esophagitis: Secondary | ICD-10-CM

## 2024-05-10 DIAGNOSIS — H3321 Serous retinal detachment, right eye: Secondary | ICD-10-CM

## 2024-05-10 MED ORDER — SUCRALFATE 1 G PO TABS
1.0000 g | ORAL_TABLET | Freq: Two times a day (BID) | ORAL | 1 refills | Status: DC
Start: 2024-05-10 — End: 2024-07-06

## 2024-05-10 MED ORDER — PANTOPRAZOLE SODIUM 40 MG PO TBEC: 40.0000 mg | DELAYED_RELEASE_TABLET | Freq: Every day | ORAL | 1 refills | Status: DC

## 2024-05-10 NOTE — Progress Notes (Signed)
 Subjective:    Patient ID: [name] [name], male    DOB: July 15, 1971, 53 y.o.   MRN: [id number]  Patient here for  Chief Complaint  Patient presents with   Medical Management of Chronic Issues    HPI Here for a scheduled follow up. Established care last visit 02/29/24. Is being followed by ophthalmology for right retinal detachment. At that visit, described abdominal discomfort - tightening from chest to upper thighs. Affects his appetite. Also, diabetes - poorly controlled. Has been followed by Dr Marlo - psychiatry - diagnosed with bipolar mania and ADHD. On depakote. Since last visit, Dr Marlo apparently is no longer practicing and his new psychiatrist discharged him from her practice. Needs a new psychiatries. He had been on his most recent medication regimen for years. Not taking now. Is agreeable to reestablish with another psychiatrist. Saw neurology 03/01/24 - neuropathy, restless legs, imbalance. Gabapentin  was increased to 400mg  tid and 600mg  q hs. Seen in ER 03/08/24 - elevated sugar. Given IVFs and insulin . Saw endocrinology 03/10/24 - recommended libre sensors. Did not like using the sensor - due to alarming all of the time. No sugar checks to review today. Was seen in ER 04/20/24 - s/p fall. Hip and leg xray - negative for fracture. Ultrasound - superficial venous thrombosis - recommended warm compresses. Keep arm elevated and NSAIDs. Has had multiple falls recently. Having knee issues. Feels like his left knee gives out. Consider ortho referral - given above. Is planning for eye surgery. Has his pre op tomorrow. No chest pain. Denies sob. Does report some persistent GI issues and abdominal discomfort. Felt protonix  and carafate  helped. Request refill. Takes 50 units of insulin  before bed.    Past Medical History:  Diagnosis Date   ADHD    Anxiety    Bipolar 1 disorder (HCC)    Diabetes mellitus without complication (HCC)    GERD (gastroesophageal reflux disease)    Hyperlipidemia     Hypertension    no meds   Inflammatory bowel disease    Injury of UCL of right wrist    PUD (peptic ulcer disease) 2007   Mild   Past Surgical History:  Procedure Laterality Date   ESOPHAGOGASTRODUODENOSCOPY  07/03/2006   Normal esophagus without evidence of Barrett's/ Normal duodenum/ Erythema and edema in the antrum /  1. Erythema, edema, and hemorrhage in the cardia likely secondary to   vomiting and retching   ESOPHAGOGASTRODUODENOSCOPY  07/10/2006   Normal esophagus with patchy areas of fundal erythema and superficial erosion./ Two small prepyloric ulcers all of which had a benign appearance status post biopsy; patent pylorus   EYE SURGERY     HAND HARDWARE REMOVAL Right 05/26/2013   INCISION AND DRAINAGE ABSCESS Right 05/26/2013   hand   OPEN REDUCTION INTERNAL FIXATION (ORIF) METACARPAL Right 04/28/2013   5th metacarpal   ULNA OSTEOTOMY Right 12/03/2017   Procedure: ULNAR SHORTENING;  Surgeon: Murrell Kuba, MD;  Location: [facility];  Service: Orthopedics;  Laterality: Right;   WRIST ARTHROSCOPY WITH DEBRIDEMENT Right 01/15/2017   Procedure: RIGHT WRIST ARTHROSCOPY WITH DEBRIDEMENT AND SHRINKAGE LUNOTRIQUETRAL TEAR;  Surgeon: Murrell Kuba, MD;  Location: [facility];  Service: Orthopedics;  Laterality: Right;  AXILLARY BLOCK   WRIST OSTEOTOMY Right 12/03/2017   Procedure: RIGHT WRIST OSTEOTOMY;  Surgeon: Murrell Kuba, MD;  Location: [facility];  Service: Orthopedics;  Laterality: Right;   Family History  Problem Relation Age of Onset   Diabetes Mother  Depression Mother    Thyroid  disease Mother    Heart disease Father    Stroke Father    Kidney disease Maternal Grandmother    Hypertension Maternal Grandmother    Hyperlipidemia Maternal Grandmother    Heart disease Maternal Grandmother    Diabetes Maternal Grandmother    Kidney disease Maternal Grandfather    Hypertension Maternal Grandfather    Hyperlipidemia Maternal  Grandfather    Heart disease Maternal Grandfather    Diabetes Maternal Grandfather    Social History   Socioeconomic History   Marital status: Divorced    Spouse name: Not on file   Number of children: Not on file   Years of education: Not on file   Highest education level: Not on file  Occupational History   Not on file  Tobacco Use   Smoking status: Never   Smokeless tobacco: Never  Substance and Sexual Activity   Alcohol use: No   Drug use: Not Currently    Types: Crack cocaine    Comment: sober since 2008   Sexual activity: Not on file  Other Topics Concern   Not on file  Social History Narrative   Not on file   Social Drivers of Health   Financial Resource Strain: Low Risk  (03/10/2024)   Received from [facility]   Overall Financial Resource Strain (CARDIA)    Difficulty of Paying Living Expenses: Not hard at all  Food Insecurity: No Food Insecurity (03/10/2024)   Received from [facility]   Hunger Vital Sign    Within the past 12 months, you worried that your food would run out before you got the money to buy more.: Never true    Within the past 12 months, the food you bought just didn't last and you didn't have money to get more.: Never true  Transportation Needs: No Transportation Needs (03/10/2024)   Received from [facility] - Transportation    In the past 12 months, has lack of transportation kept you from medical appointments or from getting medications?: No    Lack of Transportation (Non-Medical): No  Physical Activity: Not on file  Stress: Not on file  Social Connections: Not on file     Review of Systems  Constitutional:  Negative for appetite change and unexpected weight change.  HENT:  Negative for congestion and sinus pressure.   Respiratory:  Negative for cough and chest tightness.        No sob reported.   Cardiovascular:  Negative for chest pain and palpitations.       No  increased swelling   Gastrointestinal:  Negative for diarrhea and vomiting.  Genitourinary:  Negative for difficulty urinating and dysuria.  Musculoskeletal:        Knee issues as outlined.   Skin:  Negative for color change and rash.  Neurological:  Negative for dizziness and headaches.  Psychiatric/Behavioral:  Negative for agitation and dysphoric mood.        Increased stress related to the above.        Objective:     BP 136/74   Pulse 90   Ht 5' 11 (1.803 m)   Wt 169 lb 9.6 oz (76.9 kg)   SpO2 99%   BMI 23.65 kg/m  Wt Readings from Last 3 Encounters:  05/15/24 169 lb 9.6 oz (76.9 kg)  02/29/24 155 lb (70.3 kg)  01/13/24 153 lb 8 oz (69.6 kg)    Physical Exam  Vitals reviewed.  Constitutional:      General: He is not in acute distress.    Appearance: Normal appearance. He is well-developed.  HENT:     Head: Normocephalic and atraumatic.     Right Ear: External ear normal.     Left Ear: External ear normal.     Mouth/Throat:     Pharynx: No oropharyngeal exudate or posterior oropharyngeal erythema.  Eyes:     General: No scleral icterus.       Right eye: No discharge.        Left eye: No discharge.     Conjunctiva/sclera: Conjunctivae normal.  Cardiovascular:     Rate and Rhythm: Normal rate and regular rhythm.  Pulmonary:     Effort: Pulmonary effort is normal. No respiratory distress.     Breath sounds: Normal breath sounds.  Abdominal:     General: Bowel sounds are normal.     Palpations: Abdomen is soft.     Tenderness: There is no abdominal tenderness.  Musculoskeletal:        General: No tenderness.     Cervical back: Neck supple. No tenderness.  Lymphadenopathy:     Cervical: No cervical adenopathy.  Skin:    Findings: No erythema or rash.  Neurological:     Mental Status: He is alert.  Psychiatric:        Mood and Affect: Mood normal.        Behavior: Behavior normal.         Outpatient Encounter Medications as of 05/10/2024  Medication  Sig   gabapentin  (NEURONTIN ) 400 MG capsule Take 400 mg by mouth 3 (three) times daily.   gabapentin  (NEURONTIN ) 600 MG tablet Take 600 mg by mouth at bedtime.   LANTUS SOLOSTAR 100 UNIT/ML Solostar Pen Inject 50 Units into the skin 2 (two) times daily.   sucralfate  (CARAFATE ) 1 g tablet Take 1 tablet (1 g total) by mouth 2 (two) times daily.   ACCU-CHEK AVIVA PLUS test strip Use to check blood sugar up to twice a day as advised   Accu-Chek Softclix Lancets lancets Use to check blood sugar up to twice a day as advised   alprazolam (XANAX) 2 MG tablet Take 2 mg by mouth at bedtime as needed for sleep. (Patient not taking: Reported on 05/10/2024)   amphetamine-dextroamphetamine (ADDERALL) 30 MG tablet Take 100 mg by mouth 2 (two) times daily.  (Patient not taking: Reported on 05/10/2024)   Blood Glucose Monitoring Suppl (ACCU-CHEK AVIVA PLUS) w/Device KIT Use to check blood sugar twice a day as advised   diclofenac  (VOLTAREN ) 75 MG EC tablet Take 75 mg by mouth 2 (two) times daily.   divalproex (DEPAKOTE) 500 MG 24 hr tablet Take 1,500 mg by mouth daily.    metFORMIN  (GLUCOPHAGE ) 1000 MG tablet Take 1 tablet (1,000 mg total) by mouth 2 (two) times daily with a meal.   ondansetron  (ZOFRAN -ODT) 4 MG disintegrating tablet Take 1 tablet (4 mg total) by mouth every 8 (eight) hours as needed for nausea or vomiting.   pantoprazole  (PROTONIX ) 40 MG tablet Take 1 tablet (40 mg total) by mouth daily before breakfast.   rosuvastatin  (CRESTOR ) 10 MG tablet Take 1 tablet (10 mg total) by mouth daily.   [DISCONTINUED] cyclobenzaprine  (FLEXERIL ) 10 MG tablet Take 1 tablet (10 mg total) by mouth 3 (three) times daily as needed for muscle spasms.   [DISCONTINUED] finasteride (PROSCAR) 5 MG tablet Take 5 mg by mouth daily.   [DISCONTINUED] gabapentin  (NEURONTIN )  600 MG tablet Take 1 tablet (600 mg total) by mouth 3 (three) times daily.   [DISCONTINUED] LEVEMIR  FLEXPEN 100 UNIT/ML FlexPen Inject 50 Units into the skin 2  (two) times daily. (Patient taking differently: Inject 50 Units into the skin daily.)   [DISCONTINUED] pantoprazole  (PROTONIX ) 40 MG tablet Take 1 tablet (40 mg total) by mouth daily before breakfast.   [DISCONTINUED] tamsulosin (FLOMAX) 0.4 MG CAPS capsule Take 0.4 mg by mouth. (Patient not taking: Reported on 02/29/2024)   No facility-administered encounter medications on file as of 05/10/2024.     Lab Results  Component Value Date   WBC 7.2 01/13/2024   HGB 15.9 01/13/2024   HCT 47.0 01/13/2024   PLT 380 01/13/2024   GLUCOSE 464 (H) 03/07/2024   CHOL 230 (H) 03/07/2024   TRIG (H) 03/07/2024    677.0 Triglyceride is over 400; calculations on Lipids are invalid.   HDL 38.40 (L) 03/07/2024   LDLDIRECT 88.0 03/07/2024   ALT 23 03/07/2024   AST 20 03/07/2024   NA 132 (L) 03/07/2024   K 4.2 03/07/2024   CL 94 (L) 03/07/2024   CREATININE 0.68 03/07/2024   BUN 13 03/07/2024   CO2 26 03/07/2024   TSH 2.01 03/07/2024   PSA 1.25 03/07/2024   HGBA1C 14.4 (H) 03/07/2024   MICROALBUR 0.8 03/07/2024    DG Chest 2 View Result Date: 01/13/2024 CLINICAL DATA:  Shortness of breath EXAM: CHEST - 2 VIEW COMPARISON:  July 09, 2006 FINDINGS: The heart size and mediastinal contours are within normal limits. Both lungs are clear. The visualized skeletal structures are unremarkable. Old healed fracture of the left mid clavicle IMPRESSION: No active cardiopulmonary disease. Electronically Signed   By: Franky Chard M.D.   On: 01/13/2024 11:07       Assessment & Plan:  Type 2 diabetes mellitus with diabetic neuropathy, with long-term current use of insulin  (HCC) Assessment & Plan: Poorly controlled. Brought in no recorded sugar readings. Was seeing endocrinology. Taking insulin . Reports 50 units in the evening. Discussed diet and exercise as tolerated. Get him back in with endocrinology for diabetes management. Discussed the need to check sugars - to be able to adjust insulin  dosing. Discussed  possible need to add short acting insulin  with meals - or with largest meal.    Pain of upper abdomen Assessment & Plan: Abdominal discomfort as outlined. Felt carafate  helped. Continue protonix . Add carafate . GI evaluation as outlined.   Orders: -     CBC with Differential/Platelet -     Hepatic function panel -     Basic metabolic panel with GFR -     Ambulatory referral to Gastroenterology  Gastroesophageal reflux disease without esophagitis Assessment & Plan: Remains on protonix . Some abdominal discomfort as outlined. Felt carafate  helped. Request refill. Rx sent in for carafate . Discussed the need for f/u GI evaluation. Request to see Dr Burnette. Discussed considering CT scan if persistent symptoms.   Orders: -     Pantoprazole  Sodium; Take 1 tablet (40 mg total) by mouth daily before breakfast.  Dispense: 90 tablet; Refill: 1 -     Ambulatory referral to Gastroenterology  Hyperlipidemia associated with type 2 diabetes mellitus (HCC) Assessment & Plan: Low cholesterol diet and exercise. Follow lipid panel. Given diabetes, needs to be on a statin.  Lab Results  Component Value Date   CHOL 230 (H) 03/07/2024   HDL 38.40 (L) 03/07/2024   LDLDIRECT 88.0 03/07/2024   TRIG (H) 03/07/2024    677.0 Triglyceride  is over 400; calculations on Lipids are invalid.   CHOLHDL 6 03/07/2024      Bipolar disorder, in full remission, most recent episode manic [facility]) Assessment & Plan: Has been followed by Dr Marlo. He is no longer practicing  - per pt. Request f/u with new psychiatrist.   Orders: -     Ambulatory referral to Psychiatry  Attention deficit hyperactivity disorder (ADHD), combined type Assessment & Plan: Has been previously on adderall. Schedule with new psychiatrist as outlined.   Orders: -     Ambulatory referral to Psychiatry  Left knee pain, unspecified chronicity Assessment & Plan: Feels like his knee is going to give out. Persistent. Has had recent falls. Will  refer to ortho for further evaluation.    Right retinal detachment Assessment & Plan: Planning for eye surgery. Going for pre op this week. Discussed need for better sugar control. Denies chest pain or sob.    Other orders -     Sucralfate ; Take 1 tablet (1 g total) by mouth 2 (two) times daily.  Dispense: 60 tablet; Refill: 1     Allena Hamilton, MD

## 2024-05-11 ENCOUNTER — Telehealth: Payer: Self-pay

## 2024-05-11 DIAGNOSIS — M25569 Pain in unspecified knee: Secondary | ICD-10-CM

## 2024-05-11 NOTE — Telephone Encounter (Signed)
 Copied from CRM [id number]. Topic: General - Other >> May 11, 2024 11:07 AM [name] [name] wrote: Reason for CRM: Patient is calling in to remind the doctor about the knee brace they discussed he did not see it in his paperwork after he left.

## 2024-05-13 NOTE — Telephone Encounter (Signed)
 Patient is agreeable to see ortho for his knee. He does not have a preference who he sees.

## 2024-05-13 NOTE — Telephone Encounter (Signed)
 Please see me before calling. He had reported to me that he feels like his left knee is going to give out. We diiscussed this issue. I can refer him to ortho for further evaluation. They can determine best treatment (and if knee brace is warranted).

## 2024-05-14 NOTE — Telephone Encounter (Signed)
Order placed for referral to ortho 

## 2024-05-14 NOTE — Addendum Note (Signed)
 Addended by: GLENDIA ALLENA RAMAN on: 05/14/2024 11:14 AM   Modules accepted: Orders

## 2024-05-15 ENCOUNTER — Encounter: Payer: Self-pay | Admitting: Internal Medicine

## 2024-05-15 DIAGNOSIS — H3320 Serous retinal detachment, unspecified eye: Secondary | ICD-10-CM | POA: Insufficient documentation

## 2024-05-15 DIAGNOSIS — M25569 Pain in unspecified knee: Secondary | ICD-10-CM | POA: Insufficient documentation

## 2024-05-15 NOTE — Assessment & Plan Note (Signed)
 Abdominal discomfort as outlined. Felt carafate  helped. Continue protonix . Add carafate . GI evaluation as outlined.

## 2024-05-15 NOTE — Assessment & Plan Note (Signed)
 Poorly controlled. Brought in no recorded sugar readings. Was seeing endocrinology. Taking insulin . Reports 50 units in the evening. Discussed diet and exercise as tolerated. Get him back in with endocrinology for diabetes management. Discussed the need to check sugars - to be able to adjust insulin  dosing. Discussed possible need to add short acting insulin  with meals - or with largest meal.

## 2024-05-15 NOTE — Assessment & Plan Note (Signed)
 Feels like his knee is going to give out. Persistent. Has had recent falls. Will refer to ortho for further evaluation.

## 2024-05-15 NOTE — Assessment & Plan Note (Signed)
 Has been previously on adderall. Schedule with new psychiatrist as outlined.

## 2024-05-15 NOTE — Assessment & Plan Note (Signed)
 Planning for eye surgery. Going for pre op this week. Discussed need for better sugar control. Denies chest pain or sob.

## 2024-05-15 NOTE — Assessment & Plan Note (Signed)
 Low cholesterol diet and exercise. Follow lipid panel. Given diabetes, needs to be on a statin.  Lab Results  Component Value Date   CHOL 230 (H) 03/07/2024   HDL 38.40 (L) 03/07/2024   LDLDIRECT 88.0 03/07/2024   TRIG (H) 03/07/2024    677.0 Triglyceride is over 400; calculations on Lipids are invalid.   CHOLHDL 6 03/07/2024

## 2024-05-15 NOTE — Assessment & Plan Note (Signed)
 Has been followed by Dr Marlo. He is no longer practicing  - per pt. Request f/u with new psychiatrist.

## 2024-05-15 NOTE — Assessment & Plan Note (Signed)
 Remains on protonix . Some abdominal discomfort as outlined. Felt carafate  helped. Request refill. Rx sent in for carafate . Discussed the need for f/u GI evaluation. Request to see Dr Burnette. Discussed considering CT scan if persistent symptoms.

## 2024-05-18 ENCOUNTER — Telehealth: Payer: Self-pay | Admitting: Internal Medicine

## 2024-05-18 NOTE — Telephone Encounter (Signed)
 Copied from CRM [id number]. Topic: Referral - Status >> May 17, 2024  4:04 PM Frederich PARAS wrote: Reason for CRM: [facility] gastro  , she says he is scheduled for consultation on 06/20/24 at 2 pm with courtney freeman the nurse practitioner  [phone] callback if any questions

## 2024-05-18 NOTE — Telephone Encounter (Signed)
 FYI for you.

## 2024-06-05 ENCOUNTER — Other Ambulatory Visit: Payer: Self-pay | Admitting: Internal Medicine

## 2024-06-22 ENCOUNTER — Other Ambulatory Visit: Payer: Self-pay | Admitting: Internal Medicine

## 2024-06-22 NOTE — Telephone Encounter (Unsigned)
 Copied from CRM #[id number]. Topic: Clinical - Medication Refill >> Jun 22, 2024  4:20 PM Alfonso HERO wrote: Medication: LANTUS SOLOSTAR 100 UNIT/ML Solostar Pen  Has the patient contacted their pharmacy? Yes (Agent: If no, request that the patient contact the pharmacy for the refill. If patient does not wish to contact the pharmacy document the reason why and proceed with request.) (Agent: If yes, when and what did the pharmacy advise?)  This is the patient's preferred pharmacy:  [facility] [id number] [place], [place] - [address] [place] [address] Phone: [phone] Fax: [phone]  Is this the correct pharmacy for this prescription? Yes If no, delete pharmacy and type the correct one.   Has the prescription been filled recently? No  Is the patient out of the medication? No  Has the patient been seen for an appointment in the last year OR does the patient have an upcoming appointment? Yes  Can we respond through MyChart? Yes  Agent: Please be advised that Rx refills may take up to 3 business days. We ask that you follow-up with your pharmacy.  Patient has 3 pens left

## 2024-06-24 ENCOUNTER — Telehealth: Payer: Self-pay | Admitting: Internal Medicine

## 2024-06-24 NOTE — Telephone Encounter (Signed)
 I received a refill request for his insulin  (lantus). It appears he is seeing endocrinology.  (States historical provider). Please notify to contact endocrinology for refill since they are following him for his diabetes.

## 2024-06-24 NOTE — Telephone Encounter (Signed)
 Pt has an upcoming OV on 10/30

## 2024-06-24 NOTE — Telephone Encounter (Signed)
 Need to clarify with him how he is taking. Per med list - 50 units bid. Per endocrinology - states 50 units pm.

## 2024-06-24 NOTE — Telephone Encounter (Signed)
 Endocrinology does not fill this medication. It was originally filled by his previous provider at [facility].

## 2024-06-24 NOTE — Telephone Encounter (Unsigned)
 Copied from CRM #[id number]. Topic: Clinical - Medication Question >> Jun 24, 2024 11:02 AM Suzen RAMAN wrote: Reason for CRM: Patient called to check on the status of medication refill request for Medication: LANTUS SOLOSTAR 100 UNIT/ML Solostar Pen. Patient advised that medication status is still currently pending and still within the 3 processing window.

## 2024-06-24 NOTE — Telephone Encounter (Signed)
 Re-routing to appropriate office, thank you!

## 2024-06-25 MED ORDER — LANTUS SOLOSTAR 100 UNIT/ML ~~LOC~~ SOPN: 50.0000 [IU] | PEN_INJECTOR | Freq: Two times a day (BID) | SUBCUTANEOUS | 2 refills | Status: DC

## 2024-06-25 NOTE — Telephone Encounter (Signed)
 Rx ok'd for insulin  refill. He saw endocrionology 03/2024. Had been referred for continued management of his sugars. Please confirm with him that he has a f/u appt scheduled with endocrinology - if not needs to schedule.

## 2024-06-25 NOTE — Addendum Note (Signed)
 Addended by: GLENDIA ALLENA RAMAN on: 06/25/2024 12:15 PM   Modules accepted: Orders

## 2024-06-27 NOTE — Telephone Encounter (Signed)
 Left message for patient to give our office a call back to ensure he has an appointment scheduled with Endocrinologist per Dr Glendia, MD request.   OK for E2C2 to relay note if patient calls back. If relayed, please notify the office.

## 2024-06-28 NOTE — Telephone Encounter (Signed)
 called pt Endocrinologist appt hasn't been scheduled yet. Pt states will try to make an appt sometime this week. From our conversation it doesn't seem like he is interested in making anymore appointments. He states he has too many Drs to keep up with. we will follow up

## 2024-06-28 NOTE — Telephone Encounter (Unsigned)
 Copied from CRM #[id number]. Topic: General - Call Back - No Documentation >> Jun 27, 2024  4:54 PM Geneva B wrote: Reason for CRM: pt missed a call please call pt back [phone] (H)

## 2024-06-30 NOTE — Telephone Encounter (Unsigned)
 Copied from CRM [id number]. Topic: General - Other >> Jun 30, 2024 12:41 PM Macario HERO wrote: Reason for CRM: Patient calling to leave a message with provider that he has an appointment November 5th at 3:30 with Endocrinology at [facility].

## 2024-07-07 ENCOUNTER — Ambulatory Visit: Admitting: Internal Medicine

## 2024-07-07 ENCOUNTER — Encounter: Payer: Self-pay | Admitting: Internal Medicine

## 2024-07-07 VITALS — BP 160/110 | HR 108 | Temp 98.5°F | Ht 71.0 in | Wt 174.2 lb

## 2024-07-07 DIAGNOSIS — E1169 Type 2 diabetes mellitus with other specified complication: Secondary | ICD-10-CM

## 2024-07-07 DIAGNOSIS — H3321 Serous retinal detachment, right eye: Secondary | ICD-10-CM

## 2024-07-07 DIAGNOSIS — F3174 Bipolar disorder, in full remission, most recent episode manic: Secondary | ICD-10-CM

## 2024-07-07 DIAGNOSIS — E1140 Type 2 diabetes mellitus with diabetic neuropathy, unspecified: Secondary | ICD-10-CM

## 2024-07-07 DIAGNOSIS — R1010 Upper abdominal pain, unspecified: Secondary | ICD-10-CM

## 2024-07-07 DIAGNOSIS — K219 Gastro-esophageal reflux disease without esophagitis: Secondary | ICD-10-CM

## 2024-07-07 DIAGNOSIS — E785 Hyperlipidemia, unspecified: Secondary | ICD-10-CM

## 2024-07-07 DIAGNOSIS — Z23 Encounter for immunization: Secondary | ICD-10-CM

## 2024-07-07 DIAGNOSIS — F902 Attention-deficit hyperactivity disorder, combined type: Secondary | ICD-10-CM

## 2024-07-07 DIAGNOSIS — Z794 Long term (current) use of insulin: Secondary | ICD-10-CM

## 2024-07-07 MED ORDER — LANTUS SOLOSTAR 100 UNIT/ML ~~LOC~~ SOPN: 50.0000 [IU] | PEN_INJECTOR | Freq: Two times a day (BID) | SUBCUTANEOUS | 2 refills | Status: AC

## 2024-07-07 MED ORDER — OLMESARTAN MEDOXOMIL 5 MG PO TABS: 5.0000 mg | ORAL_TABLET | Freq: Every day | ORAL | 1 refills | Status: DC

## 2024-07-07 MED ORDER — SUCRALFATE 1 G PO TABS: 1.0000 g | ORAL_TABLET | Freq: Two times a day (BID) | ORAL | 1 refills | Status: AC

## 2024-07-07 MED ORDER — PANTOPRAZOLE SODIUM 40 MG PO TBEC: 40.0000 mg | DELAYED_RELEASE_TABLET | Freq: Every day | ORAL | 1 refills | Status: AC

## 2024-07-07 MED ORDER — ROSUVASTATIN CALCIUM 10 MG PO TABS: 10.0000 mg | ORAL_TABLET | Freq: Every day | ORAL | 0 refills | Status: DC

## 2024-07-07 NOTE — Progress Notes (Signed)
 Subjective:    Patient ID: [name] [name], male    DOB: 1971-06-10, 53 y.o.   MRN: [id number]  Patient here for  Chief Complaint  Patient presents with   Medical Management of Chronic Issues    HPI Here for a scheduled follow up. Relatively new pt to me. Is being followed by ophthalmology for retinal detachment. Diabetes has been poorly controlled. Has previously been followed by Dr Marlo - psychiatry. Has diagnosis of bipolar mania and ADHD. Has been on depakote. Seeing neurology (Dr Lane) - neuropathy and restless legs and balance issues. Has had multiple falls recently. Having knee issues. 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 Recommended a brace and PT. Had f/u with neurology - 06/29/24 - recommended to start aspirin 81mg  q day, restart depakote ER 500mg  - take 1000mg  nightly for bipolar and increase gabapentin  to 400mg  tid and 800mg  q hs. Feels balance is better. Is scheduled to see psychiatry. Not checking sugars. Discussed CGM. Declines. States alarms too often. Sugars - poorly controlled. Due to f/u with endocrinology next week. States blood sugars - 200-250. Breathing stable. No abdominal pain or bowel change reported.    Past Medical History:  Diagnosis Date   ADHD    Anxiety    Bipolar 1 disorder (HCC)    Diabetes mellitus without complication (HCC)    GERD (gastroesophageal reflux disease)    Hyperlipidemia    Hypertension    no meds   Inflammatory bowel disease    Injury of UCL of right wrist    PUD (peptic ulcer disease) 2007   Mild   Past Surgical History:  Procedure Laterality Date   ESOPHAGOGASTRODUODENOSCOPY  07/03/2006   Normal esophagus without evidence of Barrett's/ Normal duodenum/ Erythema and edema in the antrum /  1. Erythema, edema, and hemorrhage in the cardia likely secondary to   vomiting and retching   ESOPHAGOGASTRODUODENOSCOPY  07/10/2006   Normal esophagus with patchy areas of fundal erythema and  superficial erosion./ Two small prepyloric ulcers all of which had a benign appearance status post biopsy; patent pylorus   EYE SURGERY     HAND HARDWARE REMOVAL Right 05/26/2013   INCISION AND DRAINAGE ABSCESS Right 05/26/2013   hand   OPEN REDUCTION INTERNAL FIXATION (ORIF) METACARPAL Right 04/28/2013   5th metacarpal   ULNA OSTEOTOMY Right 12/03/2017   Procedure: ULNAR SHORTENING;  Surgeon: Murrell Kuba, MD;  Location: [facility];  Service: Orthopedics;  Laterality: Right;   WRIST ARTHROSCOPY WITH DEBRIDEMENT Right 01/15/2017   Procedure: RIGHT WRIST ARTHROSCOPY WITH DEBRIDEMENT AND SHRINKAGE LUNOTRIQUETRAL TEAR;  Surgeon: Murrell Kuba, MD;  Location: [facility];  Service: Orthopedics;  Laterality: Right;  AXILLARY BLOCK   WRIST OSTEOTOMY Right 12/03/2017   Procedure: RIGHT WRIST OSTEOTOMY;  Surgeon: Murrell Kuba, MD;  Location: [facility];  Service: Orthopedics;  Laterality: Right;   Family History  Problem Relation Age of Onset   Diabetes Mother    Depression Mother    Thyroid  disease Mother    Heart disease Father    Stroke Father    Kidney disease Maternal Grandmother    Hypertension Maternal Grandmother    Hyperlipidemia Maternal Grandmother    Heart disease Maternal Grandmother    Diabetes Maternal Grandmother    Kidney disease Maternal Grandfather    Hypertension Maternal Grandfather    Hyperlipidemia Maternal Grandfather    Heart disease Maternal Grandfather    Diabetes Maternal Grandfather  Social History   Socioeconomic History   Marital status: Divorced    Spouse name: Not on file   Number of children: Not on file   Years of education: Not on file   Highest education level: Not on file  Occupational History   Not on file  Tobacco Use   Smoking status: Never   Smokeless tobacco: Never  Substance and Sexual Activity   Alcohol use: No   Drug use: Not Currently    Types: Crack cocaine    Comment: sober since 2008    Sexual activity: Not on file  Other Topics Concern   Not on file  Social History Narrative   Not on file   Social Drivers of Health   Financial Resource Strain: High Risk (06/13/2024)   Received from [facility]   Overall Financial Resource Strain (CARDIA)    Difficulty of Paying Living Expenses: Very hard  Food Insecurity: Food Insecurity Present (06/13/2024)   [facility]   Hunger Vital Sign    Within the past 12 months, you worried that your food would run out before you got the money to buy more.: Often true    Within the past 12 months, the food you bought just didn't last and you didn't have money to get more.: Often true  Transportation Needs: No Transportation Needs (06/13/2024)   Received from [facility] - Transportation    In the past 12 months, has lack of transportation kept you from medical appointments or from getting medications?: No    Lack of Transportation (Non-Medical): No  Physical Activity: Not on file  Stress: Not on file  Social Connections: Not on file     Review of Systems  Constitutional:  Negative for appetite change and unexpected weight change.  HENT:  Negative for congestion and sinus pressure.   Respiratory:  Negative for cough, chest tightness and shortness of breath.   Cardiovascular:  Negative for chest pain, palpitations and leg swelling.  Gastrointestinal:  Negative for abdominal pain, diarrhea, nausea and vomiting.  Genitourinary:  Negative for difficulty urinating and dysuria.  Musculoskeletal:  Negative for joint swelling and myalgias.  Skin:  Negative for color change and rash.  Neurological:  Negative for dizziness and headaches.  Psychiatric/Behavioral:  Negative for agitation and dysphoric mood.        Objective:     BP (!) 160/110   Pulse (!) 108   Temp 98.5 F (36.9 C) (Oral)   Ht 5' 11 (1.803 m)   Wt 174 lb 3.2 oz (79 kg)   SpO2 100%   BMI  24.30 kg/m  Wt Readings from Last 3 Encounters:  07/07/24 174 lb 3.2 oz (79 kg)  05/15/24 169 lb 9.6 oz (76.9 kg)  02/29/24 155 lb (70.3 kg)    Physical Exam Vitals reviewed.  Constitutional:      General: He is not in acute distress.    Appearance: Normal appearance. He is well-developed.  HENT:     Head: Normocephalic and atraumatic.     Right Ear: External ear normal.     Left Ear: External ear normal.     Mouth/Throat:     Pharynx: No oropharyngeal exudate or posterior oropharyngeal erythema.  Eyes:     General: No scleral icterus.       Right eye: No discharge.        Left eye: No discharge.     Conjunctiva/sclera: Conjunctivae normal.  Cardiovascular:     Rate and Rhythm: Normal rate and regular rhythm.  Pulmonary:     Effort: Pulmonary effort is normal. No respiratory distress.     Breath sounds: Normal breath sounds.  Abdominal:     General: Bowel sounds are normal.     Palpations: Abdomen is soft.     Tenderness: There is no abdominal tenderness.  Musculoskeletal:        General: No swelling or tenderness.     Cervical back: Neck supple. No tenderness.  Lymphadenopathy:     Cervical: No cervical adenopathy.  Skin:    Findings: No erythema or rash.  Neurological:     Mental Status: He is alert.  Psychiatric:        Mood and Affect: Mood normal.        Behavior: Behavior normal.         Outpatient Encounter Medications as of 07/07/2024  Medication Sig   ACCU-CHEK AVIVA PLUS test strip Use to check blood sugar up to twice a day as advised   Accu-Chek Softclix Lancets lancets Use to check blood sugar up to twice a day as advised   aspirin EC 81 MG tablet Take 81 mg by mouth.   Blood Glucose Monitoring Suppl (ACCU-CHEK AVIVA PLUS) w/Device KIT Use to check blood sugar twice a day as advised   diclofenac  (VOLTAREN ) 75 MG EC tablet Take 75 mg by mouth 2 (two) times daily.   divalproex (DEPAKOTE) 500 MG 24 hr tablet Take 1,500 mg by mouth daily.     gabapentin  (NEURONTIN ) 400 MG capsule Take 400 mg by mouth 3 (three) times daily.   gabapentin  (NEURONTIN ) 600 MG tablet Take 600 mg by mouth at bedtime.   metFORMIN  (GLUCOPHAGE ) 1000 MG tablet Take 1 tablet (1,000 mg total) by mouth 2 (two) times daily with a meal.   olmesartan (BENICAR) 5 MG tablet Take 1 tablet (5 mg total) by mouth daily.   ondansetron  (ZOFRAN -ODT) 4 MG disintegrating tablet Take 1 tablet (4 mg total) by mouth every 8 (eight) hours as needed for nausea or vomiting.   pantoprazole  (PROTONIX ) 40 MG tablet 2 (two) times daily.   amphetamine-dextroamphetamine (ADDERALL) 30 MG tablet Take 100 mg by mouth 2 (two) times daily.  (Patient not taking: Reported on 07/07/2024)   LANTUS SOLOSTAR 100 UNIT/ML Solostar Pen Inject 50 Units into the skin 2 (two) times daily. Inject 55 units into the skin bid   pantoprazole  (PROTONIX ) 40 MG tablet Take 1 tablet (40 mg total) by mouth daily before breakfast.   rosuvastatin  (CRESTOR ) 10 MG tablet Take 1 tablet (10 mg total) by mouth daily.   sucralfate  (CARAFATE ) 1 g tablet Take 1 tablet (1 g total) by mouth 2 (two) times daily.   [DISCONTINUED] alprazolam (XANAX) 2 MG tablet Take 2 mg by mouth at bedtime as needed for sleep. (Patient not taking: Reported on 07/07/2024)   [DISCONTINUED] LANTUS SOLOSTAR 100 UNIT/ML Solostar Pen Inject 50 Units into the skin 2 (two) times daily.   [DISCONTINUED] pantoprazole  (PROTONIX ) 40 MG tablet Take 1 tablet (40 mg total) by mouth daily before breakfast.   [DISCONTINUED] rosuvastatin  (CRESTOR ) 10 MG tablet TAKE 1 TABLET BY MOUTH DAILY   [DISCONTINUED] sucralfate  (CARAFATE ) 1 g tablet Take 1 tablet (1 g total) by mouth 2 (two) times daily.   No facility-administered encounter medications on file as of 07/07/2024.     Lab Results  Component Value Date   WBC 7.2 01/13/2024   HGB 15.9 01/13/2024  HCT 47.0 01/13/2024   PLT 380 01/13/2024   GLUCOSE 464 (H) 03/07/2024   CHOL 230 (H) 03/07/2024   TRIG (H)  03/07/2024    677.0 Triglyceride is over 400; calculations on Lipids are invalid.   HDL 38.40 (L) 03/07/2024   LDLDIRECT 88.0 03/07/2024   ALT 23 03/07/2024   AST 20 03/07/2024   NA 132 (L) 03/07/2024   K 4.2 03/07/2024   CL 94 (L) 03/07/2024   CREATININE 0.68 03/07/2024   BUN 13 03/07/2024   CO2 26 03/07/2024   TSH 2.01 03/07/2024   PSA 1.25 03/07/2024   HGBA1C 14.4 (H) 03/07/2024   MICROALBUR 0.8 03/07/2024    DG Chest 2 View Result Date: 01/13/2024 CLINICAL DATA:  Shortness of breath EXAM: CHEST - 2 VIEW COMPARISON:  July 09, 2006 FINDINGS: The heart size and mediastinal contours are within normal limits. Both lungs are clear. The visualized skeletal structures are unremarkable. Old healed fracture of the left mid clavicle IMPRESSION: No active cardiopulmonary disease. Electronically Signed   By: Franky Chard M.D.   On: 01/13/2024 11:07       Assessment & Plan:  Need for influenza vaccination -     Flu vaccine trivalent PF, 6mos and older(Flulaval,Afluria,Fluarix,Fluzone)  Gastroesophageal reflux disease without esophagitis Assessment & Plan: Remains on protonix . Previous abdominal discomfort as outlined. Felt carafate  helped. Request refill. Rx sent in for carafate . Discussed the need for f/u GI evaluation. Request to see Dr Burnette. Scheduled to see GI.   Orders: -     Pantoprazole  Sodium; Take 1 tablet (40 mg total) by mouth daily before breakfast.  Dispense: 90 tablet; Refill: 1  Hyperlipidemia associated with type 2 diabetes mellitus (HCC) Assessment & Plan: Low cholesterol diet and exercise. Given diabetes, needs to be on a statin. Follow lipid panel.  Lab Results  Component Value Date   CHOL 230 (H) 03/07/2024   HDL 38.40 (L) 03/07/2024   LDLDIRECT 88.0 03/07/2024   TRIG (H) 03/07/2024    677.0 Triglyceride is over 400; calculations on Lipids are invalid.   CHOLHDL 6 03/07/2024     Orders: -     Basic metabolic panel with GFR; Future -     Hepatic function  panel; Future -     CBC with Differential/Platelet; Future -     Lipid panel; Future  Type 2 diabetes mellitus with diabetic neuropathy, with long-term current use of insulin  (HCC) Assessment & Plan: Poorly controlled. Brought in no recorded sugar readings. Was seeing endocrinology. Taking insulin . Reports 55 units bid. Discussed diet and exercise as tolerated. Get him back in with endocrinology for diabetes management. Discussed the need to check sugars - to be able to adjust insulin  dosing. Discussed possible need to add short acting insulin  with meals - or with largest meal. Discussed CGM. Declines. Alarms too often. Glucometer checks. Follow.    Right retinal detachment Assessment & Plan: Seeing ophthalmology.    Bipolar disorder, in full remission, most recent episode manic Assessment & Plan: Has been followed by Dr Marlo. He is no longer practicing  - per pt. Scheduled to follow up with psychiatry. Appears neurology has restarted his depakote. Follow.    Attention deficit hyperactivity disorder (ADHD), combined type Assessment & Plan: Plans f/u with psychiatry.    Pain of upper abdomen Assessment & Plan: Abdominal discomfort as outlined previously. Felt carafate  helped. Continue protonix . GI evaluation as outlined.    Other orders -     Lantus SoloStar; Inject 50 Units into  the skin 2 (two) times daily. Inject 55 units into the skin bid  Dispense: 15 mL; Refill: 2 -     Rosuvastatin  Calcium ; Take 1 tablet (10 mg total) by mouth daily.  Dispense: 90 tablet; Refill: 0 -     Sucralfate ; Take 1 tablet (1 g total) by mouth 2 (two) times daily.  Dispense: 60 tablet; Refill: 1 -     Olmesartan Medoxomil; Take 1 tablet (5 mg total) by mouth daily.  Dispense: 30 tablet; Refill: 1     Allena Hamilton, MD

## 2024-07-08 ENCOUNTER — Telehealth: Payer: Self-pay | Admitting: Internal Medicine

## 2024-07-08 DIAGNOSIS — E1140 Type 2 diabetes mellitus with diabetic neuropathy, unspecified: Secondary | ICD-10-CM

## 2024-07-08 NOTE — Telephone Encounter (Signed)
 Lab orders needed

## 2024-07-11 ENCOUNTER — Other Ambulatory Visit: Payer: Self-pay | Admitting: Family Medicine

## 2024-07-11 ENCOUNTER — Telehealth: Payer: Self-pay

## 2024-07-11 DIAGNOSIS — R110 Nausea: Secondary | ICD-10-CM

## 2024-07-11 NOTE — Telephone Encounter (Signed)
 Signed for future labs  - met b, A1c, lipid and liver panel.

## 2024-07-11 NOTE — Telephone Encounter (Signed)
 Copied from CRM #[id number]. Topic: Appointments - Appointment Cancel/Reschedule >> Jul 11, 2024  4:00 PM Alfonso ORN wrote: Pt would like to relay message to pcp :  retinol eye surgery 11/13 and would need to be on medication longer prior to labs ( lab visit rescheduled)

## 2024-07-11 NOTE — Telephone Encounter (Signed)
 Order placed forA1c

## 2024-07-14 ENCOUNTER — Other Ambulatory Visit

## 2024-07-14 NOTE — Telephone Encounter (Signed)
 Requested medication (s) are due for refill today: yes  Requested medication (s) are on the active medication list: yes  Last refill:  01/13/24  Future visit scheduled: yes  Notes to clinic:  Unable to refill per protocol, cannot delegate.      Requested Prescriptions  Pending Prescriptions Disp Refills   ondansetron  (ZOFRAN -ODT) 4 MG disintegrating tablet [Pharmacy Med Name: ONDANSETRON  ODT 4 MG TABLET] 30 tablet 0    Sig: DISSOLVE 1 TABLET BY MOUTH EVERY 8 HOURS AS NEEDED FOR NAUSEA AND/OR VOMITING     Not Delegated - Gastroenterology: Antiemetics - ondansetron  Failed - 07/14/2024  8:30 AM      Failed - This refill cannot be delegated      Failed - Valid encounter within last 6 months    Recent Outpatient Visits           6 months ago Type 2 diabetes mellitus with diabetic neuropathy, with long-term current use of insulin  [facility])   [facility] [place], Marsa PARAS, DO   8 months ago Type 2 diabetes mellitus with diabetic neuropathy, with long-term current use of insulin  [facility])   [facility] [place], Marsa PARAS, [place]              Passed - AST in normal range and within 360 days    AST  Date Value Ref Range Status  03/07/2024 20 0 - 37 U/L Final   SGOT(AST)  Date Value Ref Range Status  08/23/2012 26 15 - 37 Unit/L Final         Passed - ALT in normal range and within 360 days    ALT  Date Value Ref Range Status  03/07/2024 23 0 - 53 U/L Final   SGPT (ALT)  Date Value Ref Range Status  08/23/2012 46 12 - 78 U/L Final

## 2024-07-17 ENCOUNTER — Encounter: Payer: Self-pay | Admitting: Internal Medicine

## 2024-07-17 NOTE — Assessment & Plan Note (Signed)
 Abdominal discomfort as outlined previously. Felt carafate  helped. Continue protonix . GI evaluation as outlined.

## 2024-07-17 NOTE — Assessment & Plan Note (Signed)
 Has been followed by Dr Marlo. He is no longer practicing  - per pt. Scheduled to follow up with psychiatry. Appears neurology has restarted his depakote. Follow.

## 2024-07-17 NOTE — Assessment & Plan Note (Signed)
 Seeing ophthalmology

## 2024-07-17 NOTE — Assessment & Plan Note (Addendum)
 Poorly controlled. Brought in no recorded sugar readings. Was seeing endocrinology. Taking insulin . Reports 55 units bid. Discussed diet and exercise as tolerated. Get him back in with endocrinology for diabetes management. Discussed the need to check sugars - to be able to adjust insulin  dosing. Discussed possible need to add short acting insulin  with meals - or with largest meal. Discussed CGM. Declines. Alarms too often. Glucometer checks. Follow.

## 2024-07-17 NOTE — Assessment & Plan Note (Signed)
 Remains on protonix . Previous abdominal discomfort as outlined. Felt carafate  helped. Request refill. Rx sent in for carafate . Discussed the need for f/u GI evaluation. Request to see Dr Burnette. Scheduled to see GI.

## 2024-07-17 NOTE — Assessment & Plan Note (Signed)
 Low cholesterol diet and exercise. Given diabetes, needs to be on a statin. Follow lipid panel.  Lab Results  Component Value Date   CHOL 230 (H) 03/07/2024   HDL 38.40 (L) 03/07/2024   LDLDIRECT 88.0 03/07/2024   TRIG (H) 03/07/2024    677.0 Triglyceride is over 400; calculations on Lipids are invalid.   CHOLHDL 6 03/07/2024

## 2024-07-17 NOTE — Assessment & Plan Note (Signed)
 Plans f/u with psychiatry.

## 2024-07-19 ENCOUNTER — Other Ambulatory Visit

## 2024-08-03 ENCOUNTER — Encounter: Payer: Self-pay | Admitting: Internal Medicine

## 2024-08-03 DIAGNOSIS — Z0000 Encounter for general adult medical examination without abnormal findings: Secondary | ICD-10-CM | POA: Insufficient documentation

## 2024-08-09 ENCOUNTER — Telehealth: Payer: Self-pay

## 2024-08-09 NOTE — Telephone Encounter (Signed)
 Lvm okay to relay message

## 2024-08-09 NOTE — Telephone Encounter (Signed)
 If he feels the blood pressure medication is making him sick, then have him hold the benicar . See if symptoms resolve. Confirm no problems with any other blood pressure medication. If no, can try amlodipine 5mg  q day. Follow pressures.

## 2024-08-09 NOTE — Telephone Encounter (Signed)
 Copied from CRM #[id number]. Topic: Clinical - Medication Question >> Aug 09, 2024 12:06 PM Thersia C wrote: Reason for CRM: Patient called in stated he isnt taking any more blood pressure medication until he can get his nausea medication refilled as well, wanted to send this over to Dr.Scott   [phone]

## 2024-08-11 NOTE — Telephone Encounter (Signed)
 Pt returned Daijah phone call. He states he has no issues with the amlodipine and is welcome to go back on it and will continue to monitor his pressure. He also was concerned about coming in tomorrow to do his labs because of the impending weather, he had his third eye surgery today. I assured him that as far as we know the offices will remain open until other announcements are made but if he feels unsafe driving to call us  to reschedule.

## 2024-08-12 ENCOUNTER — Other Ambulatory Visit

## 2024-08-15 MED ORDER — AMLODIPINE BESYLATE 5 MG PO TABS: 5.0000 mg | ORAL_TABLET | Freq: Every day | ORAL | 1 refills | Status: AC

## 2024-08-15 NOTE — Addendum Note (Signed)
 Addended by: GLENDIA ALLENA RAMAN on: 08/15/2024 12:22 PM   Modules accepted: Orders

## 2024-08-15 NOTE — Telephone Encounter (Signed)
Left message to call the office back regarding lab results  

## 2024-08-15 NOTE — Telephone Encounter (Signed)
 Reviewed messages. He apparently is off benicar  due to nausea. Confirm since he has been off that the nausea has subsided.  Also, see if he has bene checking his blood pressure. If so, need to document readings. I will send in a prescription for amlodipine  5mg  q day. Follow pressures. Let us  know if any problems.

## 2024-08-19 NOTE — Telephone Encounter (Signed)
 Attempted to call pt, no answer.  LMTCB. Ok to relay from Dr. Glendia:  Reviewed messages. He apparently is off benicar  due to nausea. Confirm since he has been off that the nausea has subsided.  Also, see if he has bene checking his blood pressure. If so, need to document readings. I will send in a prescription for amlodipine  5mg  q day. Follow pressures. Let us  know if any problems.

## 2024-09-05 ENCOUNTER — Other Ambulatory Visit: Payer: Self-pay | Admitting: Internal Medicine

## 2024-09-06 NOTE — Telephone Encounter (Signed)
 Denied refill for benicar . Pt no longer taking. Removed from medication list.

## 2024-09-13 ENCOUNTER — Ambulatory Visit: Payer: MEDICAID | Admitting: Internal Medicine

## 2024-09-13 DIAGNOSIS — K219 Gastro-esophageal reflux disease without esophagitis: Secondary | ICD-10-CM

## 2024-09-13 NOTE — Progress Notes (Deleted)
 "  Subjective:    Patient ID: [name] [name], male    DOB: 1971/01/21, 54 y.o.   MRN: [id number]  Patient here for No chief complaint on file.   HPI Here for a scheduled follow up - follow up regarding diabetes, hypertension and hypercholesterolemia. Is being followed by ophthalmology for retinal detachment. Has previously been followed by Dr Marlo - psychiatry. Has diagnosis of bipolar mania and ADHD. Has been on depakote. Seeing neurology (Dr Lane) - neuropathy and restless legs and balance issues. Has had multiple falls recently. Having knee issues. 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 Recommended a brace and PT. Had f/u with neurology - 06/29/24 - recommended to start aspirin 81mg  q day, restart depakote ER 500mg  - take 1000mg  nightly for bipolar and increase gabapentin  to 400mg  tid and 800mg  q hs. S/p recent eye surgery - left eye - 08/11/24. Had f/u 09/12/24. Continues f/u with ophthalmology. Seeing endocrinology - last visit 07/13/24. Was instructed to continue 55 units of lantus  bid and start novolog 10 units with breakfast and lunch and 15 units with supper. Continue metformin . Recommended f/u in 6 weeks. A1c 07/14/24 - 13.9. was referred to GI last visit. Did not keep appt.    Past Medical History:  Diagnosis Date   ADHD    Anxiety    Bipolar 1 disorder (HCC)    Diabetes mellitus without complication (HCC)    GERD (gastroesophageal reflux disease)    Hyperlipidemia    Hypertension    no meds   Inflammatory bowel disease    Injury of UCL of right wrist    PUD (peptic ulcer disease) 2007   Mild   Past Surgical History:  Procedure Laterality Date   ESOPHAGOGASTRODUODENOSCOPY  07/03/2006   Normal esophagus without evidence of Barrett's/ Normal duodenum/ Erythema and edema in the antrum /  1. Erythema, edema, and hemorrhage in the cardia likely secondary to   vomiting and retching   ESOPHAGOGASTRODUODENOSCOPY  07/10/2006   Normal  esophagus with patchy areas of fundal erythema and superficial erosion./ Two small prepyloric ulcers all of which had a benign appearance status post biopsy; patent pylorus   EYE SURGERY     HAND HARDWARE REMOVAL Right 05/26/2013   INCISION AND DRAINAGE ABSCESS Right 05/26/2013   hand   OPEN REDUCTION INTERNAL FIXATION (ORIF) METACARPAL Right 04/28/2013   5th metacarpal   ULNA OSTEOTOMY Right 12/03/2017   Procedure: ULNAR SHORTENING;  Surgeon: Murrell Kuba, MD;  Location: [facility];  Service: Orthopedics;  Laterality: Right;   WRIST ARTHROSCOPY WITH DEBRIDEMENT Right 01/15/2017   Procedure: RIGHT WRIST ARTHROSCOPY WITH DEBRIDEMENT AND SHRINKAGE LUNOTRIQUETRAL TEAR;  Surgeon: Murrell Kuba, MD;  Location: [facility];  Service: Orthopedics;  Laterality: Right;  AXILLARY BLOCK   WRIST OSTEOTOMY Right 12/03/2017   Procedure: RIGHT WRIST OSTEOTOMY;  Surgeon: Murrell Kuba, MD;  Location:  SURGERY CENTER;  Service: Orthopedics;  Laterality: Right;   Family History  Problem Relation Age of Onset   Diabetes Mother    Depression Mother    Thyroid  disease Mother    Heart disease Father    Stroke Father    Kidney disease Maternal Grandmother    Hypertension Maternal Grandmother    Hyperlipidemia Maternal Grandmother    Heart disease Maternal Grandmother    Diabetes Maternal Grandmother    Kidney disease Maternal Grandfather    Hypertension Maternal Grandfather    Hyperlipidemia Maternal Grandfather    Heart disease  Maternal Grandfather    Diabetes Maternal Grandfather    Social History   Socioeconomic History   Marital status: Divorced    Spouse name: Not on file   Number of children: Not on file   Years of education: Not on file   Highest education level: Not on file  Occupational History   Not on file  Tobacco Use   Smoking status: Never   Smokeless tobacco: Never  Substance and Sexual Activity   Alcohol use: No   Drug use: Not Currently     Types: Crack cocaine    Comment: sober since 2008   Sexual activity: Not on file  Other Topics Concern   Not on file  Social History Narrative   Not on file   Social Drivers of Health   Tobacco Use: Low Risk (09/12/2024)   Received from [facility]   Patient History    Smoking Tobacco Use: Never    Smokeless Tobacco Use: Never    Passive Exposure: Not on file  Financial Resource Strain: High Risk (06/13/2024)   Received from [facility]   Overall Financial Resource Strain (CARDIA)    Difficulty of Paying Living Expenses: Very hard  Food Insecurity: Food Insecurity Present (06/13/2024)   Received from [facility]   Epic    Within the past 12 months, you worried that your food would run out before you got the money to buy more.: Often true    Within the past 12 months, the food you bought just didn't last and you didn't have money to get more.: Often true  Transportation Needs: No Transportation Needs (06/13/2024)   Received from [facility] - Transportation    In the past 12 months, has lack of transportation kept you from medical appointments or from getting medications?: No    Lack of Transportation (Non-Medical): No  Physical Activity: Not on file  Stress: Not on file  Social Connections: Not on file  Depression (PHQ2-9): Medium Risk (11/12/2023)   Depression (PHQ2-9)    PHQ-2 Score: 10  Alcohol Screen: Not on file  Housing: Low Risk  (06/13/2024)   Received from [facility]   Epic    In the last 12 months, was there a time when you were not able to pay the mortgage or rent on time?: No    In the past 12 months, how many times have you moved where you were living?: 0    At any time in the past 12 months, were you homeless or living in a shelter (including now)?: No  Utilities: Not At Risk (06/13/2024)   Received from [facility]   Epic    In the past 12 months has the  electric, gas, oil, or water company threatened to shut off services in your home?: No  Health Literacy: Not on file     Review of Systems     Objective:     There were no vitals taken for this visit. Wt Readings from Last 3 Encounters:  07/07/24 174 lb 3.2 oz (79 kg)  05/15/24 169 lb 9.6 oz (76.9 kg)  02/29/24 155 lb (70.3 kg)    Physical Exam  {Perform Simple Foot Exam  Perform Detailed exam:1} {Insert foot Exam (Optional):30965}   Outpatient Encounter Medications as of 09/13/2024  Medication Sig   ACCU-CHEK AVIVA PLUS test strip Use to check blood sugar up to twice a day as advised  Accu-Chek Softclix Lancets lancets Use to check blood sugar up to twice a day as advised   amLODipine  (NORVASC ) 5 MG tablet Take 1 tablet (5 mg total) by mouth daily.   amphetamine-dextroamphetamine (ADDERALL) 30 MG tablet Take 100 mg by mouth 2 (two) times daily.  (Patient not taking: Reported on 07/07/2024)   aspirin EC 81 MG tablet Take 81 mg by mouth.   Blood Glucose Monitoring Suppl (ACCU-CHEK AVIVA PLUS) w/Device KIT Use to check blood sugar twice a day as advised   diclofenac  (VOLTAREN ) 75 MG EC tablet Take 75 mg by mouth 2 (two) times daily.   divalproex (DEPAKOTE) 500 MG 24 hr tablet Take 1,500 mg by mouth daily.    gabapentin  (NEURONTIN ) 400 MG capsule Take 400 mg by mouth 3 (three) times daily.   gabapentin  (NEURONTIN ) 600 MG tablet Take 600 mg by mouth at bedtime.   LANTUS  SOLOSTAR 100 UNIT/ML Solostar Pen Inject 50 Units into the skin 2 (two) times daily. Inject 55 units into the skin bid   metFORMIN  (GLUCOPHAGE ) 1000 MG tablet Take 1 tablet (1,000 mg total) by mouth 2 (two) times daily with a meal.   ondansetron  (ZOFRAN -ODT) 4 MG disintegrating tablet Take 1 tablet (4 mg total) by mouth every 8 (eight) hours as needed for nausea or vomiting.   pantoprazole  (PROTONIX ) 40 MG tablet Take 1 tablet (40 mg total) by mouth daily before breakfast.   pantoprazole  (PROTONIX ) 40 MG tablet 2  (two) times daily.   rosuvastatin  (CRESTOR ) 10 MG tablet Take 1 tablet (10 mg total) by mouth daily.   sucralfate  (CARAFATE ) 1 g tablet Take 1 tablet (1 g total) by mouth 2 (two) times daily.   [DISCONTINUED] olmesartan  (BENICAR ) 5 MG tablet Take 1 tablet (5 mg total) by mouth daily.   No facility-administered encounter medications on file as of 09/13/2024.     Lab Results  Component Value Date   WBC 7.2 01/13/2024   HGB 15.9 01/13/2024   HCT 47.0 01/13/2024   PLT 380 01/13/2024   GLUCOSE 464 (H) 03/07/2024   CHOL 230 (H) 03/07/2024   TRIG (H) 03/07/2024    677.0 Triglyceride is over 400; calculations on Lipids are invalid.   HDL 38.40 (L) 03/07/2024   LDLDIRECT 88.0 03/07/2024   ALT 23 03/07/2024   AST 20 03/07/2024   NA 132 (L) 03/07/2024   K 4.2 03/07/2024   CL 94 (L) 03/07/2024   CREATININE 0.68 03/07/2024   BUN 13 03/07/2024   CO2 26 03/07/2024   TSH 2.01 03/07/2024   PSA 1.25 03/07/2024   HGBA1C 14.4 (H) 03/07/2024   MICROALBUR 0.8 03/07/2024    DG Chest 2 View Result Date: 01/13/2024 CLINICAL DATA:  Shortness of breath EXAM: CHEST - 2 VIEW COMPARISON:  July 09, 2006 FINDINGS: The heart size and mediastinal contours are within normal limits. Both lungs are clear. The visualized skeletal structures are unremarkable. Old healed fracture of the left mid clavicle IMPRESSION: No active cardiopulmonary disease. Electronically Signed   By: Franky Chard M.D.   On: 01/13/2024 11:07       Assessment & Plan:  Gastroesophageal reflux disease without esophagitis     Allena Hamilton, MD "

## 2024-09-18 ENCOUNTER — Encounter: Payer: Self-pay | Admitting: Internal Medicine

## 2024-09-18 NOTE — Progress Notes (Signed)
 Patient ID: [name] [name], male   DOB: 1971-06-07, 54 y.o.   MRN: [id number] Canceled appt.

## 2024-10-02 ENCOUNTER — Other Ambulatory Visit: Payer: Self-pay | Admitting: Internal Medicine

## 2024-11-04 ENCOUNTER — Ambulatory Visit: Payer: MEDICAID | Admitting: Internal Medicine
